# Patient Record
Sex: Male | Born: 1955
Health system: Southern US, Community
[De-identification: ages and names within clinical notes are randomized; demographics above are authoritative.]

## PROBLEM LIST (undated history)

## (undated) DIAGNOSIS — E119 Type 2 diabetes mellitus without complications: Secondary | ICD-10-CM

## (undated) DIAGNOSIS — Z889 Allergy status to unspecified drugs, medicaments and biological substances status: Secondary | ICD-10-CM

## (undated) DIAGNOSIS — I10 Essential (primary) hypertension: Secondary | ICD-10-CM

## (undated) HISTORY — DX: Type 2 diabetes mellitus without complications: E11.9

## (undated) HISTORY — DX: Allergy status to unspecified drugs, medicaments and biological substances: Z88.9

---
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
## 2004-03-20 ENCOUNTER — Encounter: Admission: RE | Admit: 2004-03-20 | Discharge: 2004-03-20 | Payer: Self-pay | Admitting: Family Medicine

## 2011-06-04 ENCOUNTER — Encounter: Payer: Self-pay | Admitting: Student

## 2011-06-04 ENCOUNTER — Emergency Department (HOSPITAL_BASED_OUTPATIENT_CLINIC_OR_DEPARTMENT_OTHER)
Admission: EM | Admit: 2011-06-04 | Discharge: 2011-06-04 | Disposition: A | Discharge status: Home / Self Care | Payer: Self-pay | Attending: Emergency Medicine | Admitting: Emergency Medicine

## 2011-06-04 DIAGNOSIS — L089 Local infection of the skin and subcutaneous tissue, unspecified: Secondary | ICD-10-CM

## 2011-06-04 DIAGNOSIS — Y92009 Unspecified place in unspecified non-institutional (private) residence as the place of occurrence of the external cause: Secondary | ICD-10-CM | POA: Insufficient documentation

## 2011-06-04 DIAGNOSIS — I10 Essential (primary) hypertension: Secondary | ICD-10-CM | POA: Insufficient documentation

## 2011-06-04 DIAGNOSIS — W540XXA Bitten by dog, initial encounter: Secondary | ICD-10-CM | POA: Insufficient documentation

## 2011-06-04 DIAGNOSIS — S91309A Unspecified open wound, unspecified foot, initial encounter: Secondary | ICD-10-CM | POA: Insufficient documentation

## 2011-06-04 DIAGNOSIS — S61409A Unspecified open wound of unspecified hand, initial encounter: Secondary | ICD-10-CM | POA: Insufficient documentation

## 2011-06-04 HISTORY — DX: Essential (primary) hypertension: I10

## 2011-06-04 MED ORDER — TETANUS-DIPHTH-ACELL PERTUSSIS 5-2.5-18.5 LF-MCG/0.5 IM SUSP
0.5000 mL | Freq: Once | INTRAMUSCULAR | Status: AC
  Administered 2011-06-04: 0.5 mL via INTRAMUSCULAR
  Filled 2011-06-04: qty 0.5

## 2011-06-04 MED ORDER — AMOXICILLIN-POT CLAVULANATE 875-125 MG PO TABS: 1.0000 | ORAL_TABLET | Freq: Two times a day (BID) | ORAL | Status: AC

## 2011-06-04 MED ORDER — AMOXICILLIN-POT CLAVULANATE 875-125 MG PO TABS: 1.0000 | ORAL_TABLET | Freq: Once | ORAL | Status: DC

## 2011-06-04 MED ORDER — AMOXICILLIN-POT CLAVULANATE 875-125 MG PO TABS
1.0000 | ORAL_TABLET | Freq: Once | ORAL | Status: AC
  Administered 2011-06-04: 1 via ORAL
  Filled 2011-06-04: qty 1

## 2011-06-04 NOTE — ED Notes (Signed)
Pt in with c/o dog bite to left foot and left hand last night. Known animal. Animal control has not been notified. Bite happened in [place]. 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

## 2011-06-04 NOTE — ED Notes (Signed)
[facility] called for report of dog bite. Pt unable to name dog owner but has address of [address]. Pt can be contacted at [phone] H and [phone] Cell.

## 2011-06-05 NOTE — ED Provider Notes (Signed)
History     Chief Complaint  Patient presents with  . Animal Bite    left foot and left hand   HPI Comments: Yesterday without provocation, neighbor's dog broke leash and went after patient, patient tripped and fell down and was bitten to left foot and base of ankle, no sig bleeding there, was able to get up and walk, was reaching down to grab at the dog who then bit him in left hand and punctured into dorsum of little finger near tip of finger and also scratched into ring finger as well.  These injuries did bleed some, he cleaned them and used bandages to help decrease bleeding.  He went to work today and left little finger has gotten more swollen and painful.  No fevers, has no problems moving fingers or ankle and denies any sig pain.  His neighbor reported to him that rabies was up to date, but never saw documentation.  Pt's tetanus is not up to date.    Patient is a 55 y.o. male presenting with animal bite. The history is provided by the patient.  Animal Bite  Pertinent negatives include no numbness, no headaches, no light-headedness and no weakness.    Past Medical History  Diagnosis Date  . Hypertension     Past Surgical History  Procedure Date  . Fracture surgery     History reviewed. No pertinent family history.  History  Substance Use Topics  . Smoking status: Never Smoker   . Smokeless tobacco: Never Used  . Alcohol Use: No      Review of Systems  Constitutional: Negative.   Musculoskeletal: Positive for arthralgias.  Skin: Positive for wound.  Neurological: Negative for weakness, light-headedness, numbness and headaches.  Hematological: Negative for adenopathy.    Physical Exam  BP 141/74  Pulse 100  Temp(Src) 98.5 F (36.9 C) (Oral)  Resp 20  SpO2 95%  Physical Exam  Constitutional: He appears well-developed and well-nourished.  Non-toxic appearance.  Musculoskeletal:       Hands:      Feet:  Neurological: He is alert. He has normal strength.    Skin: Skin is warm and dry. Abrasion noted. No bruising and no laceration noted.    ED Course  Procedures  MDM Tetanus updated.  Animal control contacted and officer spoke to patient to visit pt's neighbor to file a report and make sure that dog has rabies up to date.  otherwise I presume animal control can quarantine if needed.  Pt voices understanding.  Will put on augmentin, primarily since little finger appears to show some early signs of cellulitis.  No evidence of infectious tenosynovitis at present.  Referred to Dr. Melvyn Novas.        Gavin Pound. Oletta Lamas, MD 06/05/11 [phone]

## 2012-12-20 ENCOUNTER — Encounter: Payer: Self-pay | Admitting: *Deleted

## 2012-12-20 ENCOUNTER — Ambulatory Visit (INDEPENDENT_AMBULATORY_CARE_PROVIDER_SITE_OTHER): Payer: BC Managed Care – PPO | Admitting: Family Medicine

## 2012-12-20 ENCOUNTER — Encounter: Payer: Self-pay | Admitting: Family Medicine

## 2012-12-20 VITALS — BP 128/76 | HR 78 | Temp 98.3°F | Ht 64.0 in | Wt 236.0 lb

## 2012-12-20 DIAGNOSIS — Z125 Encounter for screening for malignant neoplasm of prostate: Secondary | ICD-10-CM

## 2012-12-20 DIAGNOSIS — I10 Essential (primary) hypertension: Secondary | ICD-10-CM

## 2012-12-20 DIAGNOSIS — D171 Benign lipomatous neoplasm of skin and subcutaneous tissue of trunk: Secondary | ICD-10-CM

## 2012-12-20 DIAGNOSIS — G5712 Meralgia paresthetica, left lower limb: Secondary | ICD-10-CM

## 2012-12-20 DIAGNOSIS — D1779 Benign lipomatous neoplasm of other sites: Secondary | ICD-10-CM

## 2012-12-20 DIAGNOSIS — G5710 Meralgia paresthetica, unspecified lower limb: Secondary | ICD-10-CM

## 2012-12-20 HISTORY — DX: Meralgia paresthetica, left lower limb: G57.12

## 2012-12-20 HISTORY — DX: Benign lipomatous neoplasm of skin and subcutaneous tissue of trunk: D17.1

## 2012-12-20 HISTORY — DX: Essential (primary) hypertension: I10

## 2012-12-20 HISTORY — DX: Morbid (severe) obesity due to excess calories: E66.01

## 2012-12-20 LAB — LIPID PANEL
Cholesterol: 149 mg/dL (ref 0–200)
LDL Cholesterol: 98 mg/dL (ref 0–99)
Total CHOL/HDL Ratio: 4
VLDL: 14.4 mg/dL (ref 0.0–40.0)

## 2012-12-20 LAB — PSA: PSA: 1.05 ng/mL (ref 0.10–4.00)

## 2012-12-20 LAB — CBC WITH DIFFERENTIAL/PLATELET
Basophils Absolute: 0.1 10*3/uL (ref 0.0–0.1)
Eosinophils Relative: 2.4 % (ref 0.0–5.0)
Lymphocytes Relative: 28.8 % (ref 12.0–46.0)
Lymphs Abs: 1.4 10*3/uL (ref 0.7–4.0)
MCV: 88.3 fl (ref 78.0–100.0)
Monocytes Relative: 7.6 % (ref 3.0–12.0)
Neutrophils Relative %: 59.9 % (ref 43.0–77.0)
Platelets: 220 10*3/uL (ref 150.0–400.0)

## 2012-12-20 LAB — BASIC METABOLIC PANEL
BUN: 14 mg/dL (ref 6–23)
CO2: 27 mEq/L (ref 19–32)
Calcium: 9.4 mg/dL (ref 8.4–10.5)
Chloride: 107 mEq/L (ref 96–112)
Creatinine, Ser: 0.9 mg/dL (ref 0.4–1.5)
Potassium: 4.3 mEq/L (ref 3.5–5.1)
Sodium: 140 mEq/L (ref 135–145)

## 2012-12-20 LAB — HEPATIC FUNCTION PANEL: AST: 25 U/L (ref 0–37)

## 2012-12-20 LAB — TSH: TSH: 2.55 u[IU]/mL (ref 0.35–5.50)

## 2012-12-20 MED ORDER — LOSARTAN POTASSIUM 100 MG PO TABS: 100.0000 mg | ORAL_TABLET | Freq: Every day | ORAL | Status: DC

## 2012-12-20 NOTE — Patient Instructions (Addendum)
Follow up as scheduled for your physical We'll notify you of your lab results and make any changes if needed We'll call you with your nutrition appt Try and make healthy food choices- cut back on any drinks w/ sugar in it (including tea) Goal is to get some form of exercise or activity 4-5 days/week The lump on your back is called a lipoma and is just fine The thigh numbness is called meralgia paresthetica and is caused by irritation or compression of a nerve- will improve w/ weight loss Call with any questions or concerns Welcome!  We're glad to have you!

## 2012-12-20 NOTE — Assessment & Plan Note (Signed)
New.  Reviewed dx w/ pt.  Discussed that there is no need for removal unless he develops pain or area rapidly changes in size or shape.  Pt expressed understanding and is in agreement w/ plan.

## 2012-12-20 NOTE — Assessment & Plan Note (Signed)
New.  Reviewed dx w/ pt and discussed that this was most likely due to his abdominal obesity.  Pt to work on weight loss and see if sxs improve.  Will follow.

## 2012-12-20 NOTE — Assessment & Plan Note (Signed)
New.  Pt's BMI is 40.  Not exercising or paying particular attention to diet.  Will refer to nutrition for assistance.  Encouraged regular activity.  Will follow closely.

## 2012-12-20 NOTE — Assessment & Plan Note (Signed)
New to provider, chronic for pt.  Well controlled.  Asymptomatic.  Pt desires ARB rather than ACE due to cough.  Will switch and follow BP closely.

## 2012-12-20 NOTE — Progress Notes (Signed)
  Subjective:    Patient ID: Jacob Crawford, male    DOB: 1956-05-29, 57 y.o.   MRN: [id number]  HPI New to establish.  Previous MD- Jovita Kussmaul.  No recent CPE or blood work.  HTN- chronic problem, dx'd 20 yrs ago.  On Lisinopril 20mg  daily.  BP well controlled.  No CP, SOB, HAs, visual changes, edema.  Would like to switch to ARB due to cough.  R upper back 'knot'- 1st noticed ~1 yr ago, previous MD told him it was nothing concerning.  Not painful.  Not growing or changing.  No drainage or redness.  L lateral thigh numbness- started 'months ago as an itch'.  Evolved into a 'burning pain'.  sxs have not travelled or radiated.  Obesity- pt's BMI is 40.  Not exercising regularly or following particular diet.  Did buy Garcinia Cambogia and wanted advice prior to taking.  Will drink 1-2 large teas from [employer].  Rarely drinks soda.     Review of Systems For ROS see HPI     Objective:   Physical Exam  Vitals reviewed. Constitutional: He is oriented to person, place, and time. He appears well-developed and well-nourished. No distress.  obese  HENT:  Head: Normocephalic and atraumatic.  Eyes: Conjunctivae and EOM are normal. Pupils are equal, round, and reactive to light.  Neck: Normal range of motion. Neck supple. No thyromegaly present.  Cardiovascular: Normal rate, regular rhythm, normal heart sounds and intact distal pulses.   No murmur heard. Pulmonary/Chest: Effort normal and breath sounds normal. No respiratory distress.  Abdominal: Soft. Bowel sounds are normal. He exhibits no distension.  Musculoskeletal: He exhibits no edema.  5 cm soft tissue mass consistent w/ lipoma just R of spine in upper back  Lymphadenopathy:    He has no cervical adenopathy.  Neurological: He is alert and oriented to person, place, and time. He has normal reflexes. No cranial nerve deficit.  Skin: Skin is warm and dry.  Psychiatric: He has a normal mood and affect. His behavior is normal.           Assessment & Plan:

## 2012-12-27 ENCOUNTER — Ambulatory Visit (INDEPENDENT_AMBULATORY_CARE_PROVIDER_SITE_OTHER): Payer: BC Managed Care – PPO | Admitting: Family Medicine

## 2012-12-27 ENCOUNTER — Encounter: Payer: Self-pay | Admitting: Family Medicine

## 2012-12-27 VITALS — BP 140/88 | HR 79 | Temp 98.2°F | Ht 64.0 in | Wt 236.4 lb

## 2012-12-27 DIAGNOSIS — Z1211 Encounter for screening for malignant neoplasm of colon: Secondary | ICD-10-CM

## 2012-12-27 DIAGNOSIS — Z0000 Encounter for general adult medical examination without abnormal findings: Secondary | ICD-10-CM | POA: Insufficient documentation

## 2012-12-27 HISTORY — DX: Encounter for general adult medical examination without abnormal findings: Z00.00

## 2012-12-27 NOTE — Patient Instructions (Addendum)
Follow up in 3 months to recheck BP We'll call you with your GI appt Try and get regular exercise and make healthy food choices Call with any questions or concerns [place] (hopefully!)

## 2012-12-27 NOTE — Assessment & Plan Note (Signed)
Pt's PE WNL w/ exception of obesity.  Reviewed labs from last visit.  Refer for colonoscopy.  EKG done- see document for interpretation.  Anticipatory guidance provided.

## 2012-12-27 NOTE — Progress Notes (Signed)
  Subjective:    Patient ID: Jacob Crawford, male    DOB: 1956-07-05, 57 y.o.   MRN: [id number]  HPI CPE- no concerns today, labs done at last visit.  Has never had colonoscopy.   Review of Systems Patient reports no vision/hearing changes, anorexia, fever ,adenopathy, persistant/recurrent hoarseness, swallowing issues, chest pain, palpitations, edema, persistant/recurrent cough, hemoptysis, dyspnea (rest,exertional, paroxysmal nocturnal), gastrointestinal  bleeding (melena, rectal bleeding), abdominal pain, excessive heart burn, GU symptoms (dysuria, hematuria, voiding/incontinence issues) syncope, focal weakness, memory loss, skin/hair/nail changes, depression, anxiety, abnormal bruising/bleeding, musculoskeletal symptoms/signs.     Objective:   Physical Exam BP 140/88  Pulse 79  Temp(Src) 98.2 F (36.8 C) (Oral)  Ht 5\' 4"  (1.626 m)  Wt 236 lb 6.4 oz (107.23 kg)  BMI 40.56 kg/m2  SpO2 95%  General Appearance:    Alert, cooperative, no distress, appears stated age  Head:    Normocephalic, without obvious abnormality, atraumatic  Eyes:    PERRL, conjunctiva/corneas clear, EOM's intact, fundi    benign, both eyes       Ears:    Normal TM's and external ear canals, both ears  Nose:   Nares normal, septum midline, mucosa normal, no drainage   or sinus tenderness  Throat:   Lips, mucosa, and tongue normal; teeth and gums normal  Neck:   Supple, symmetrical, trachea midline, no adenopathy;       thyroid:  No enlargement/tenderness/nodules  Back:     Symmetric, no curvature, ROM normal, no CVA tenderness  Lungs:     Clear to auscultation bilaterally, respirations unlabored  Chest wall:    No tenderness or deformity  Heart:    Regular rate and rhythm, S1 and S2 normal, no murmur, rub   or gallop  Abdomen:     Soft, non-tender, bowel sounds active all four quadrants,    no masses, no organomegaly  Genitalia:    Normal male without lesion, discharge or tenderness  Rectal:    Normal  tone, normal prostate, no masses or tenderness  Extremities:   Extremities normal, atraumatic, no cyanosis or edema  Pulses:   2+ and symmetric all extremities  Skin:   Skin color, texture, turgor normal, no rashes or lesions  Lymph nodes:   Cervical, supraclavicular, and axillary nodes normal  Neurologic:   CNII-XII intact. Normal strength, sensation and reflexes      throughout          Assessment & Plan:

## 2012-12-30 ENCOUNTER — Encounter: Payer: Self-pay | Admitting: Gastroenterology

## 2013-01-02 ENCOUNTER — Encounter: Payer: Self-pay | Admitting: Family Medicine

## 2013-01-02 ENCOUNTER — Ambulatory Visit (INDEPENDENT_AMBULATORY_CARE_PROVIDER_SITE_OTHER): Payer: BC Managed Care – PPO | Admitting: Family Medicine

## 2013-01-02 VITALS — BP 130/80 | HR 95 | Temp 98.4°F | Ht 64.0 in | Wt 236.8 lb

## 2013-01-02 DIAGNOSIS — J069 Acute upper respiratory infection, unspecified: Secondary | ICD-10-CM

## 2013-01-02 MED ORDER — PROMETHAZINE-DM 6.25-15 MG/5ML PO SYRP: 5.0000 mL | ORAL_SOLUTION | Freq: Four times a day (QID) | ORAL | Status: DC | PRN

## 2013-01-02 MED ORDER — FLUTICASONE PROPIONATE 50 MCG/ACT NA SUSP: 2.0000 | Freq: Every day | NASAL | Status: DC

## 2013-01-02 NOTE — Progress Notes (Signed)
  Subjective:    Patient ID: Jacob Crawford, male    DOB: 12/20/1955, 57 y.o.   MRN: [id number]  HPI URI- sxs started yesterday AM w/ 'scratchy throat'.  Today woke up w/ productive green cough, hoarseness, chest tightness.  'i know it's a sinus infection'.  + nasal congestion, HA.  No ear pain but fullness.  No fever.  + sick contacts- daughter w/ sinus infxn.  No personal hx of asthma.  Hx of seasonal allergies.  'if it's not treated w/ an antibiotic it will go into bronchitis'.     Review of Systems For ROS see HPI     Objective:   Physical Exam  Constitutional: He appears well-developed and well-nourished. No distress.  HENT:  Head: Normocephalic and atraumatic.  No TTP over sinuses + turbinate edema + PND TMs normal bilaterally  Eyes: Conjunctivae and EOM are normal. Pupils are equal, round, and reactive to light.  Neck: Normal range of motion. Neck supple.  Cardiovascular: Normal rate, regular rhythm and normal heart sounds.   Pulmonary/Chest: Effort normal and breath sounds normal. No respiratory distress. He has no wheezes. He has no rales.  No cough heard  Lymphadenopathy:    He has no cervical adenopathy.  Skin: Skin is warm and dry.          Assessment & Plan:

## 2013-01-02 NOTE — Patient Instructions (Addendum)
This appears to be an allergy/viral combo Start the Flonase- 2 sprays each nostril daily Use the cough syrup as needed- may cause drowsiness so take at home first to know how you'll respond Use Mucinex DM for daytime cough- this will not cause drowsiness Drink plenty of fluids REST! If no better by the end of the week or if symptoms change or worsen- please call Hang in there!!

## 2013-01-02 NOTE — Assessment & Plan Note (Signed)
New.  Suspect allergy/viral combo as there is no evidence of bacterial infxn on PE.  No need for abx.  Start nasal steroid, cough syrup prn.  Reviewed supportive care and red flags that should prompt return.  Pt unhappy that no abx written.

## 2013-01-05 ENCOUNTER — Telehealth: Payer: Self-pay | Admitting: *Deleted

## 2013-01-05 MED ORDER — AZITHROMYCIN 250 MG PO TABS: ORAL_TABLET | ORAL | Status: DC

## 2013-01-05 NOTE — Telephone Encounter (Signed)
Pt seen on 01-02-13 for URI and still no better. Pt still c/o sore throat, cough/pain in rib and unable to sleep due coughing spell. Pt notes that he does have cough med but it is now helping. Pt is requesting a antibiotic.  [facility]

## 2013-01-05 NOTE — Telephone Encounter (Signed)
Ok for [employer]- no other cough med options b/c of his codeine allergy.

## 2013-01-05 NOTE — Telephone Encounter (Signed)
Spoke with the pt and informed him that we will call-in Z-pak,but no cough med options b/c of his codeine allergy.    Pt understood and agreed.  New rx sent to the pharmacy([facility] [address]) by e-script.//AB/CMA

## 2013-01-20 ENCOUNTER — Encounter: Payer: BC Managed Care – PPO | Attending: Family Medicine | Admitting: Dietician

## 2013-01-20 ENCOUNTER — Encounter: Payer: Self-pay | Admitting: Dietician

## 2013-01-20 VITALS — Ht 64.0 in | Wt 236.4 lb

## 2013-01-20 DIAGNOSIS — Z713 Dietary counseling and surveillance: Secondary | ICD-10-CM | POA: Insufficient documentation

## 2013-01-20 DIAGNOSIS — I10 Essential (primary) hypertension: Secondary | ICD-10-CM | POA: Insufficient documentation

## 2013-01-20 NOTE — Progress Notes (Signed)
Medical Nutrition Therapy:  Appt start time: 0800 end time:  0900.  Assessment:  Primary concerns today: wt loss.   MEDICATIONS: see list.   DIETARY INTAKE:  Usual eating pattern includes 2 meals and 1 snacks per day.  Everyday foods include unsweet tea, OJ with meds.  Avoided foods include none.    24-hr recall:  B ( AM): unsweet iced tea; seldom eats breakfast (~twice per week- either sausage and egg biscuit or scrambled eggs and bacon)  Snk ( AM): generally none  L ( PM): grilled chix salad from [employer] with fat free [place] dressing, sometimes ranch or blue cheese. With cherry tomatoes, 3-4 pieces cucumber, usually with cheese. Usually will eat fast food, occasionally will get a burger, BBQ, fried chix. Has made concerted efforts in past 2 months to eat salad for lunch. Unsweet iced tea with sweet n low. Snk ( PM): eats something as soon as arriving home- small bag of chips, salad, etc. D ( PM): eats 7-830, occasionally very late- will eat a meat in large portion, some veg (salad, wide variety of vegetables, sometimes starchy veg), limited amount of starch (potatoes, rice). Snk ( PM): not much, states he does not eat much sweets Beverages: 1 can soda every 2 weeks. Small amount of OJ in AM with meds.   Pt states snack type foods are not kept much in the house. Also he does not keep much in the way of sweets around.  Pt states preference for mostly protein foods and veg, not many starches.  Usual physical activity: begins work between 0730 and 0830, sometimes 0630. His day consists of lots of sitting and some walking around job site, which is relatively light.   Portion control of dinner meal is stated as a concern by pt's spouse. Physical activity is other primary concern noted by pt family.  Progress Towards Goal(s):  In progress.   Nutritional Diagnosis:  [place]-3.3 Overweight/obesity As related to low PA, history of poor portion control of high kcal foods, such as meats at  dinner, fast foods for lunch at work.  As evidenced by BMI=40.7.    Intervention:  Nutrition counseling provided regarding portion sizes of certain foods, the Plate Method for controlling the dinner plate, and the importance of breakfast (especially protein foods at this time) in aiding wt loss efforts. Counseling provided regarding best choices when eating out for lunch at fast food restaurants, best types of snack foods, and portion control of snacking.   RD discussed with patient the importance of PA above rest (increased HR) to aid in wt loss and general health improvement.   RD established goals with the patient that are considered achievable and realistic with his preferences and lifestyle.  Handouts given during visit include:  Hypertension Nutrition Therapy  Home Workout  Goals:  Mr. Yapp will eat a protein food with breakfast 4 days per week.  Mr. Weidler will eat dinner 4 days a week that is half nonstarchy vegetables, one quarter protein, and one quarter starch. Mr. Lant will walk 150 minutes per week.   Monitoring/Evaluation:  Dietary intake, exercise, portion control, and body weight in 5 week(s). Consider [place], sagittal diameter, or waist circumference measure to aid in tracking of progress.

## 2013-01-20 NOTE — Patient Instructions (Addendum)
Goals:  Jacob Crawford will eat a protein food with breakfast 4 days per week.  Jacob Crawford will eat dinner 4 days a week that is half nonstarchy vegetables, one quarter protein, and one quarter starch. Jacob Crawford will walk 150 minutes per week.

## 2013-02-02 ENCOUNTER — Encounter: Payer: Self-pay | Admitting: Gastroenterology

## 2013-02-02 ENCOUNTER — Ambulatory Visit (AMBULATORY_SURGERY_CENTER): Payer: BC Managed Care – PPO | Admitting: *Deleted

## 2013-02-02 VITALS — Ht 63.0 in | Wt 237.8 lb

## 2013-02-02 DIAGNOSIS — Z1211 Encounter for screening for malignant neoplasm of colon: Secondary | ICD-10-CM

## 2013-02-02 MED ORDER — MOVIPREP 100 G PO SOLR: ORAL | Status: DC

## 2013-02-17 ENCOUNTER — Encounter: Payer: Self-pay | Admitting: Gastroenterology

## 2013-02-17 ENCOUNTER — Telehealth: Payer: Self-pay | Admitting: Family Medicine

## 2013-02-17 ENCOUNTER — Ambulatory Visit (AMBULATORY_SURGERY_CENTER): Payer: BC Managed Care – PPO | Admitting: Gastroenterology

## 2013-02-17 VITALS — BP 146/97 | HR 76 | Temp 97.8°F | Resp 16 | Ht 63.0 in | Wt 237.0 lb

## 2013-02-17 DIAGNOSIS — Z1211 Encounter for screening for malignant neoplasm of colon: Secondary | ICD-10-CM

## 2013-02-17 DIAGNOSIS — D126 Benign neoplasm of colon, unspecified: Secondary | ICD-10-CM

## 2013-02-17 MED ORDER — SODIUM CHLORIDE 0.9 % IV SOLN: 500.0000 mL | INTRAVENOUS | Status: DC

## 2013-02-17 NOTE — Progress Notes (Signed)
Called to room to assist during endoscopic procedure.  Patient ID and intended procedure confirmed with present staff. Received instructions for my participation in the procedure from the performing physician.  

## 2013-02-17 NOTE — Progress Notes (Signed)
Report to pacu rn, vss, bbs=clear 

## 2013-02-17 NOTE — Patient Instructions (Addendum)

## 2013-02-17 NOTE — Op Note (Signed)
[facility] [address] [place], [address]   COLONOSCOPY PROCEDURE REPORT  PATIENT: Jacob Crawford, Jacob Crawford  MR#: [id number] BIRTHDATE: 08-Apr-1956 , 56  yrs. old GENDER: Male ENDOSCOPIST: Mardella Layman, MD, Clementeen Graham REFERRED BY:  Sheliah Hatch, M.D. PROCEDURE DATE:  02/17/2013 PROCEDURE:   Colonoscopy, screening and Colonoscopy with snare polypectomy ASA CLASS:   Class II INDICATIONS:Average risk patient for colon cancer. MEDICATIONS: Propofol (Diprivan) 230 mg IV  DESCRIPTION OF PROCEDURE:   After the risks and benefits and of the procedure were explained, informed consent was obtained.  A digital rectal exam revealed no abnormalities of the rectum.    The LB [id number]  endoscope was introduced through the anus and advanced to the cecum, which was identified by both the appendix and ileocecal valve .  The quality of the prep was good, using MoviPrep .  The instrument was then slowly withdrawn as the colon was fully examined.     COLON FINDINGS: A polypoid shaped semi-pedunculated polyp ranging between 5-70mm in size was found in the descending colon.  A polypectomy was performed using snare cautery.  The resection was complete and the polyp tissue was completely retrieved.   Two small smooth flat polyps were found in the sigmoid colon.  A polypectomy was performed using snare cautery.  The resection was complete and the polyp tissue was not retrieved.   The colon mucosa was otherwise normal.     Retroflexed views revealed no abnormalities. The scope was then withdrawn from the patient and the procedure completed.  COMPLICATIONS: There were no complications. ENDOSCOPIC IMPRESSION: 1.   Semi-pedunculated polyp ranging between 5-53mm in size was found in the descending colon; polypectomy was performed using snare cautery 2.   Two small flat polyps were found in the sigmoid colon; polypectomy was performed using snare cautery 3.   The colon mucosa  was otherwise normal  RECOMMENDATIONS: Repeat colonoscopy in 5 years if polyp adenomatous; otherwise 10 years Has sleep apnea and needs referral  REPEAT EXAM:  cc:  _______________________________ eSignedMardella Layman, MD, [facility] 02/17/2013 8:48 AM

## 2013-02-17 NOTE — Telephone Encounter (Signed)
Caller: Lisa/Spouse; Phone: [phone]; Reason for Call: Caller reports that patient had a colonoscopy performed at [facility] today (02/17/13).  Dr Jarold Motto was very concerned with patient regarding sleep apnea.  Dr Jarold Motto advised for caller to get in touch with PCP today and begin steps of having patient evaluated before he undergoes any futher surgical procedures.  OFFICE PLEASE FOLLOW UP WITH CALLER.

## 2013-02-17 NOTE — Progress Notes (Signed)
Patient did not experience any of the following events: a burn prior to discharge; a fall within the facility; wrong site/side/patient/procedure/implant event; or a hospital transfer or hospital admission upon discharge from the facility. (G8907) Patient did not have preoperative order for IV antibiotic SSI prophylaxis. (G8918)  

## 2013-02-20 ENCOUNTER — Telehealth: Payer: Self-pay | Admitting: *Deleted

## 2013-02-20 ENCOUNTER — Ambulatory Visit: Payer: BC Managed Care – PPO | Admitting: Dietician

## 2013-02-20 NOTE — Telephone Encounter (Signed)
  Follow up Call-  Call back number 02/17/2013  Post procedure Call Back phone  # [phone]  Permission to leave phone message Yes     Patient questions:  Do you have a fever, pain , or abdominal swelling? no Pain Score  0 *  Have you tolerated food without any problems? yes  Have you been able to return to your normal activities? yes  Do you have any questions about your discharge instructions: Diet   no Medications  no Follow up visit  no  Do you have questions or concerns about your Care? no  Actions: * If pain score is 4 or above: No action needed, pain <4.

## 2013-02-21 ENCOUNTER — Telehealth: Payer: Self-pay | Admitting: Gastroenterology

## 2013-02-21 DIAGNOSIS — G4730 Sleep apnea, unspecified: Secondary | ICD-10-CM

## 2013-02-21 NOTE — Telephone Encounter (Signed)
Informed wife of appt with Dr Shelle Iron for 03/15/13 at 3pm.

## 2013-02-22 ENCOUNTER — Encounter: Payer: Self-pay | Admitting: Gastroenterology

## 2013-03-15 ENCOUNTER — Institutional Professional Consult (permissible substitution): Payer: BC Managed Care – PPO | Admitting: Pulmonary Disease

## 2013-03-29 ENCOUNTER — Institutional Professional Consult (permissible substitution): Payer: BC Managed Care – PPO | Admitting: Pulmonary Disease

## 2013-03-30 ENCOUNTER — Encounter: Payer: Self-pay | Admitting: Pulmonary Disease

## 2013-03-30 ENCOUNTER — Ambulatory Visit (INDEPENDENT_AMBULATORY_CARE_PROVIDER_SITE_OTHER): Payer: BC Managed Care – PPO | Admitting: Pulmonary Disease

## 2013-03-30 VITALS — BP 130/86 | HR 78 | Temp 97.1°F | Ht 63.0 in | Wt 245.0 lb

## 2013-03-30 DIAGNOSIS — G4733 Obstructive sleep apnea (adult) (pediatric): Secondary | ICD-10-CM

## 2013-03-30 HISTORY — DX: Obstructive sleep apnea (adult) (pediatric): G47.33

## 2013-03-30 NOTE — Progress Notes (Signed)
Subjective:    Patient ID: Jacob Crawford, male    DOB: 24-Dec-1955, 57 y.o.   MRN: [id number]  HPI The patient is a 57 year old male who been asked to see for possible obstructive sleep apnea.  The patient has been noted to have loud snoring during sleep, and in fact he sleeps on his sofa each night.  His wife has noticed an abnormal breathing pattern during sleep, and this was also noted at a recent colonoscopy.  The patient does not feel rested at least 50% in the mornings, but stays very busy at work which does not allow him to become sleepy.  In the evenings however, he will fall asleep as soon as he sits down and becomes quiet.  He is unable to get through television shows or movies.  He denies any issues with sleepiness while driving.  The patient states his weight is up 10 pounds over the last 2 years, and his Epworth score today is 7.  Sleep Questionnaire What time do you typically go to bed?( Between what hours) 10p-12a 10p-12a at 0947 on 03/30/13 by Nita Sells, CMA How long does it take you to fall asleep? within minutes within minutes at 0947 on 03/30/13 by Nita Sells, CMA How many times during the night do you wake up? 1 1 at 0947 on 03/30/13 by Nita Sells, CMA What time do you get out of bed to start your day? [id number] [id number] at 0947 on 03/30/13 by Nita Sells, CMA Do you drive or operate heavy machinery in your occupation? No No at 0947 on 03/30/13 by Nita Sells, CMA How much has your weight changed (up or down) over the past two years? (In pounds) No Value 10lb up/down at 0947 on 03/30/13 by Nita Sells, CMA Have you ever had a sleep study before? No No at 0947 on 03/30/13 by Nita Sells, CMA Do you currently use CPAP? No No at 0947 on 03/30/13 by Nita Sells, CMA Do you wear oxygen at any time? No     Review of Systems  Constitutional: Negative for fever and unexpected weight change.  HENT: Negative for ear pain, nosebleeds, congestion,  sore throat, rhinorrhea, sneezing, trouble swallowing, dental problem, postnasal drip and sinus pressure.   Eyes: Negative for redness and itching.  Respiratory: Negative for cough, chest tightness, shortness of breath and wheezing.   Cardiovascular: Negative for palpitations and leg swelling.  Gastrointestinal: Negative for nausea and vomiting.  Genitourinary: Negative for dysuria.  Musculoskeletal: Negative for joint swelling.  Skin: Negative for rash.  Neurological: Negative for headaches.  Hematological: Does not bruise/bleed easily.  Psychiatric/Behavioral: Negative for dysphoric mood. The patient is not nervous/anxious.        Objective:   Physical Exam Constitutional:  Obese male, no acute distress  HENT:  Nares patent without discharge, but narrowed bilat  Oropharynx without exudate, palate and uvula are thick and elongated, +side wall narrowing  Eyes:  Perrla, eomi, no scleral icterus  Neck:  No JVD, no TMG  Cardiovascular:  Normal rate, regular rhythm, no rubs or gallops.  No murmurs        Intact distal pulses  Pulmonary :  Normal breath sounds, no stridor or respiratory distress   No rales, rhonchi, or wheezing  Abdominal:  Soft, nondistended, bowel sounds present.  No tenderness noted.   Musculoskeletal:  No lower extremity edema noted.  Lymph Nodes:  No cervical lymphadenopathy noted  Skin:  No cyanosis noted  Neurologic:  Appears sleepy, but appropriate, moves all 4 extremities without obvious deficit.         Assessment & Plan:

## 2013-03-30 NOTE — Assessment & Plan Note (Signed)
The patient's history is very suggestive of clinically significant sleep apnea.  He is morbidly obese, has a large neck, has loud snoring and witnessed apneas with nonrestorative sleep, and also notes inappropriate sleepiness.  I've had a long discussion with him about the pathophysiology of sleep apnea, including its impact to his quality of life and cardiovascular health.  I think he needs to have a sleep study done, and he is an excellent candidate for home sleep testing

## 2013-03-30 NOTE — Patient Instructions (Addendum)
Will set up for sleep study, and arrange followup once results are available.  Work on weight loss

## 2013-04-10 ENCOUNTER — Encounter: Payer: BC Managed Care – PPO | Attending: Family Medicine | Admitting: Dietician

## 2013-04-10 ENCOUNTER — Encounter: Payer: Self-pay | Admitting: Dietician

## 2013-04-10 VITALS — Wt 238.8 lb

## 2013-04-10 DIAGNOSIS — I10 Essential (primary) hypertension: Secondary | ICD-10-CM | POA: Insufficient documentation

## 2013-04-10 DIAGNOSIS — Z713 Dietary counseling and surveillance: Secondary | ICD-10-CM | POA: Insufficient documentation

## 2013-04-10 NOTE — Progress Notes (Signed)
A: Pt reports this morning with weight gain of 2 lb 6 oz since last meeting. He has had a referral to pulmonology (where he weighed in at 245 lbs on 5/22) for sleep apnea, with no concrete diagnosis as of yet. Pt is awaiting overnight testing, but reports MD is confident sleep apnea diagnosis is correct.  Pt has adopted breakfast eating of eggs, eggs an bacon, or greek yogurt. He continues to eat predominantly salads for lunches.   Related to sleep apnea, pt reports he has been very tired all day, and has not yet increased PA, although he has gotten a membership to [employer].   Pt has stopped adding salt to some foods, and has avoided binge eating. Pt has begun to decrease portions of proteins at dinner, and has taken, particularly in past week, to filling up on vegetables.  Vegetables are reported as predominantly canned variety for dinner, over frozen and/or fresh.  Pt reports some hunger before lunch in AM, and is choosing nuts as main option for snack, at portion of palm.   I: RD reinforced positive changes to diet, such as adopting breakfast eating, and choosing mainly vegetables as method to fill himself. RD encouraged pt to continue toward other goals, particularly physical activity, with goal of 150 minutes per week (sleep apnea likely greatly infringing on this effort).  Per pt request, RD assigned set diet plan as outlined below: B- 1 CHO, 3-4 Pro, 2 fat L- 2 CHO, 3-4 Pro, 2 fat D- 2 CHO, 3-4 Pro, 2 fat 2 Snacks- 1 CHO, 1 Pro, 1 fat each For max intake of 1802 kcal per day, expected intake of 1552 kcal per day.  M/E: Monitor diet adherence, wt, body composition, PA in 6 weeks.

## 2013-04-17 ENCOUNTER — Ambulatory Visit: Payer: BC Managed Care – PPO | Admitting: Pulmonary Disease

## 2013-04-17 DIAGNOSIS — G4733 Obstructive sleep apnea (adult) (pediatric): Secondary | ICD-10-CM

## 2013-04-17 DIAGNOSIS — G4730 Sleep apnea, unspecified: Secondary | ICD-10-CM

## 2013-04-17 DIAGNOSIS — R0609 Other forms of dyspnea: Secondary | ICD-10-CM

## 2013-04-17 DIAGNOSIS — R0989 Other specified symptoms and signs involving the circulatory and respiratory systems: Secondary | ICD-10-CM

## 2013-04-17 DIAGNOSIS — G4710 Hypersomnia, unspecified: Secondary | ICD-10-CM

## 2013-04-19 ENCOUNTER — Other Ambulatory Visit: Payer: Self-pay | Admitting: Family Medicine

## 2013-04-28 ENCOUNTER — Telehealth: Payer: Self-pay | Admitting: Pulmonary Disease

## 2013-04-28 NOTE — Telephone Encounter (Signed)
Will call pt once results received.  Pt aware.

## 2013-05-03 ENCOUNTER — Telehealth: Payer: Self-pay | Admitting: Pulmonary Disease

## 2013-05-03 DIAGNOSIS — G4733 Obstructive sleep apnea (adult) (pediatric): Secondary | ICD-10-CM

## 2013-05-03 NOTE — Telephone Encounter (Signed)
Pt needs ov to review sleep study results.  

## 2013-05-04 NOTE — Telephone Encounter (Signed)
Please advise if this has been taken care of and can be closed, thank you!!

## 2013-05-04 NOTE — Telephone Encounter (Signed)
Yes this is a duplicate message---pt has been called to schedule

## 2013-05-04 NOTE — Telephone Encounter (Signed)
LMOM x 1 (home #) Unable to to schedule at this time. Pt will call back later--hes at work

## 2013-05-08 ENCOUNTER — Encounter: Payer: Self-pay | Admitting: Pulmonary Disease

## 2013-05-18 ENCOUNTER — Ambulatory Visit (INDEPENDENT_AMBULATORY_CARE_PROVIDER_SITE_OTHER): Payer: BC Managed Care – PPO | Admitting: Pulmonary Disease

## 2013-05-18 ENCOUNTER — Encounter: Payer: Self-pay | Admitting: Pulmonary Disease

## 2013-05-18 VITALS — BP 128/90 | HR 67 | Temp 98.2°F | Ht 63.0 in | Wt 237.8 lb

## 2013-05-18 DIAGNOSIS — G4733 Obstructive sleep apnea (adult) (pediatric): Secondary | ICD-10-CM

## 2013-05-18 NOTE — Progress Notes (Signed)
  Subjective:    Patient ID: Jacob Crawford, male    DOB: 1956/09/07, 57 y.o.   MRN: [id number]  HPI The patient comes in today for followup of his recent sleep testing.  He was found to have moderate obstructive sleep apnea, with an AHI of 21 events per hour and oxygen desaturation as low as 80%.  I have reviewed the study with him in detail, and answered all of his questions.   Review of Systems  Constitutional: Negative for fever and unexpected weight change.  HENT: Positive for sore throat ( scratchy throat). Negative for ear pain, nosebleeds, congestion, rhinorrhea, sneezing, trouble swallowing, dental problem, postnasal drip and sinus pressure.   Eyes: Negative for redness and itching.  Respiratory: Negative for cough, chest tightness, shortness of breath and wheezing.   Cardiovascular: Negative for palpitations and leg swelling.  Gastrointestinal: Negative for nausea and vomiting.  Genitourinary: Negative for dysuria.  Musculoskeletal: Negative for joint swelling.  Skin: Negative for rash.  Neurological: Negative for headaches.  Hematological: Does not bruise/bleed easily.  Psychiatric/Behavioral: Negative for dysphoric mood. The patient is not nervous/anxious.        Objective:   Physical Exam Obese male in no acute distress Nose without purulence or discharge noted Neck large, difficult to assess for lymphadenopathy and thyromegaly Lower extremities with mild edema, no cyanosis Awake, but does appear to be sleepy, moves all 4 extremities.        Assessment & Plan:

## 2013-05-18 NOTE — Assessment & Plan Note (Signed)
The patient has moderate obstructive sleep apnea, and given his history, I have recommended a trial of CPAP while working on weight loss.  I do not think he is a good candidate for surgery, but could consider a dental appliance.  The patient will would like to take the weekend and discuss with his wife, and will call me next week with his decision.

## 2013-05-18 NOTE — Patient Instructions (Addendum)
Think about starting on cpap while trying to work on weight loss.  I do not think surgery or dental appliance are good options Please call us next week to let us know what you decide.

## 2013-05-22 NOTE — Telephone Encounter (Signed)
Patient had appt 05/18/13 with KC. Nothing further needed.

## 2013-05-26 ENCOUNTER — Other Ambulatory Visit: Payer: Self-pay | Admitting: Pulmonary Disease

## 2013-05-26 ENCOUNTER — Telehealth: Payer: Self-pay | Admitting: Pulmonary Disease

## 2013-05-26 DIAGNOSIS — G4733 Obstructive sleep apnea (adult) (pediatric): Secondary | ICD-10-CM

## 2013-05-26 NOTE — Telephone Encounter (Signed)
Done

## 2013-05-26 NOTE — Telephone Encounter (Signed)
Pt had OV on 05-18-13 and was advised the following: Think about starting on cpap while trying to work on weight loss. I do not think surgery or dental appliance are good options  Please call us next week to let us know what you decide.   Pt states he is ready to start CPAP. I advised of the process. Please advise on what to order. Thanks. Carron Curie, CMA

## 2013-05-29 ENCOUNTER — Encounter: Payer: BC Managed Care – PPO | Attending: Family Medicine | Admitting: Dietician

## 2013-05-29 ENCOUNTER — Encounter: Payer: Self-pay | Admitting: Dietician

## 2013-05-29 DIAGNOSIS — I10 Essential (primary) hypertension: Secondary | ICD-10-CM | POA: Insufficient documentation

## 2013-05-29 DIAGNOSIS — Z713 Dietary counseling and surveillance: Secondary | ICD-10-CM | POA: Insufficient documentation

## 2013-05-29 NOTE — Progress Notes (Signed)
A: Pt reports with modest wt loss, and c/o of feeling "washed out" and very tired. He was working out 3 times per week between 75-90 minutes each (brisk walking on treadmill), and claimed he got down to about 233 lb, but his schedule had been thrown off by work starting July 1st. He hopes to return to workout schedule this week, and will be getting a CPAP machine this week to treat sleep apnea.   Pt reports no issues with following diet, the PA portion is more difficult due to very long hours at work. Pt reports so long as he truly monitors his snacking choices, and keeps his starch portions under control, he has maintained weight loss and has seen some modest progress here.   Pt motivation remains high and he has been effectively making food choices and controlling kcal intake to between 1500 and 1800 kcal, but physical activity is suffering due to work schedule and side effects of sleep apnea.  I: RD encouraged pt to continue with progress made in diet and counseled pt on importance of exercise/physical activity.   Continue plan of care, pt has expressed understanding of what needs to be done, and claims he will return to exercise this week.  M/E: Monitor body weight, exercise, diet. F/U in 2 months.

## 2013-07-22 ENCOUNTER — Other Ambulatory Visit: Payer: Self-pay | Admitting: Family Medicine

## 2013-07-25 NOTE — Telephone Encounter (Signed)
Rx filled and sent to pharmacy. Pt made aware of needing an office visit. SW, CMA

## 2013-07-31 ENCOUNTER — Ambulatory Visit: Payer: BC Managed Care – PPO | Admitting: Dietician

## 2013-09-19 ENCOUNTER — Other Ambulatory Visit: Payer: Self-pay | Admitting: Family Medicine

## 2013-09-19 NOTE — Telephone Encounter (Signed)
Med filled per provider. Letter mailed to pt to schedule appt.

## 2013-09-19 NOTE — Telephone Encounter (Signed)
Needs OV.  Ok for #30, no refills

## 2013-09-19 NOTE — Telephone Encounter (Signed)
Last OV 01-02-13 Med filled 9-13 #30 with 0

## 2013-10-13 ENCOUNTER — Ambulatory Visit: Payer: BC Managed Care – PPO | Admitting: Dietician

## 2013-10-17 ENCOUNTER — Ambulatory Visit (INDEPENDENT_AMBULATORY_CARE_PROVIDER_SITE_OTHER): Payer: BC Managed Care – PPO | Admitting: Family Medicine

## 2013-10-17 ENCOUNTER — Encounter: Payer: Self-pay | Admitting: Family Medicine

## 2013-10-17 VITALS — BP 134/82 | HR 83 | Temp 98.1°F | Ht 64.0 in | Wt 246.4 lb

## 2013-10-17 DIAGNOSIS — I10 Essential (primary) hypertension: Secondary | ICD-10-CM

## 2013-10-17 LAB — BASIC METABOLIC PANEL
BUN: 13 mg/dL (ref 6–23)
CO2: 23 mEq/L (ref 19–32)
Calcium: 8.8 mg/dL (ref 8.4–10.5)
Chloride: 105 mEq/L (ref 96–112)
Creatinine, Ser: 1.2 mg/dL (ref 0.4–1.5)
Glucose, Bld: 119 mg/dL — ABNORMAL HIGH (ref 70–99)

## 2013-10-17 MED ORDER — LOSARTAN POTASSIUM 100 MG PO TABS: 100.0000 mg | ORAL_TABLET | Freq: Every day | ORAL | Status: DC

## 2013-10-17 NOTE — Assessment & Plan Note (Signed)
Unchanged.  Pt has actually gained weight since last visit.  Encouraged regular, aerobic activity for 30 minutes at least 4x/week and monitoring caloric intake w/ the help of My[employer]itnessPal app.

## 2013-10-17 NOTE — Addendum Note (Signed)
Addended by: Verdie Shire on: 10/17/2013 10:06 AM   Modules accepted: Orders

## 2013-10-17 NOTE — Patient Instructions (Signed)
Schedule your complete physical in 6 months We'll notify you of your lab results and make any changes if needed Try and make healthy food choices and get regular exercise Call with any questions or concerns Happy Holidays!!!

## 2013-10-17 NOTE — Assessment & Plan Note (Signed)
Chronic problem.  Adequate control.  Asymptomatic.  Check labs.  No anticipated med changes 

## 2013-10-17 NOTE — Progress Notes (Signed)
   Subjective:    Patient ID: QUAN CYBULSKI, male    DOB: 12-Feb-1956, 57 y.o.   MRN: [id number]  HPI Pre visit review using our clinic review tool, if applicable. No additional management support is needed unless otherwise documented below in the visit note.  HTN- chronic problem, adequate control on Losartan.  Denies CP, SOB, HAs, visual changes, edema.  Obesity- not exercising regularly, attempting to eat healthy diet but has gained weight.   Review of Systems For ROS see HPI     Objective:   Physical Exam  Vitals reviewed. Constitutional: He is oriented to person, place, and time. He appears well-developed and well-nourished. No distress.  HENT:  Head: Normocephalic and atraumatic.  Eyes: Conjunctivae and EOM are normal. Pupils are equal, round, and reactive to light.  Neck: Normal range of motion. Neck supple. No thyromegaly present.  Cardiovascular: Normal rate, regular rhythm, normal heart sounds and intact distal pulses.   No murmur heard. Pulmonary/Chest: Effort normal and breath sounds normal. No respiratory distress.  Abdominal: Soft. Bowel sounds are normal. He exhibits no distension.  Musculoskeletal: He exhibits no edema.  Lymphadenopathy:    He has no cervical adenopathy.  Neurological: He is alert and oriented to person, place, and time. No cranial nerve deficit.  Skin: Skin is warm and dry.  Psychiatric: He has a normal mood and affect. His behavior is normal.          Assessment & Plan:

## 2013-10-17 NOTE — Progress Notes (Signed)
Pre visit review using our clinic review tool, if applicable. No additional management support is needed unless otherwise documented below in the visit note. 

## 2013-10-18 ENCOUNTER — Encounter: Payer: Self-pay | Admitting: *Deleted

## 2013-11-01 ENCOUNTER — Other Ambulatory Visit: Payer: Self-pay | Admitting: General Practice

## 2013-11-01 ENCOUNTER — Ambulatory Visit (INDEPENDENT_AMBULATORY_CARE_PROVIDER_SITE_OTHER): Payer: BC Managed Care – PPO | Admitting: Family Medicine

## 2013-11-01 ENCOUNTER — Encounter: Payer: Self-pay | Admitting: Family Medicine

## 2013-11-01 VITALS — BP 130/80 | HR 80 | Temp 98.4°F | Resp 16 | Wt 243.1 lb

## 2013-11-01 DIAGNOSIS — R1013 Epigastric pain: Secondary | ICD-10-CM

## 2013-11-01 HISTORY — DX: Epigastric pain: R10.13

## 2013-11-01 LAB — BASIC METABOLIC PANEL
BUN: 11 mg/dL (ref 6–23)
Chloride: 108 mEq/L (ref 96–112)
GFR: 100.83 mL/min (ref >60.00)
Potassium: 3.9 mEq/L (ref 3.5–5.1)
Sodium: 141 mEq/L (ref 135–145)

## 2013-11-01 LAB — CBC WITH DIFFERENTIAL/PLATELET
Basophils Absolute: 0 10*3/uL (ref 0.0–0.1)
Basophils Relative: 0.3 % (ref 0.0–3.0)
Eosinophils Absolute: 0.1 10*3/uL (ref 0.0–0.7)
Lymphocytes Relative: 26.6 % (ref 12.0–46.0)
Lymphs Abs: 1.4 10*3/uL (ref 0.7–4.0)
MCHC: 34.2 g/dL (ref 30.0–36.0)
MCV: 88.5 fl (ref 78.0–100.0)
Monocytes Absolute: 0.3 10*3/uL (ref 0.1–1.0)
Neutrophils Relative %: 65.6 % (ref 43.0–77.0)
Platelets: 206 10*3/uL (ref 150.0–400.0)
RDW: 13.5 % (ref 11.5–14.6)

## 2013-11-01 LAB — HEPATIC FUNCTION PANEL
ALT: 41 U/L (ref 0–53)
AST: 22 U/L (ref 0–37)
Bilirubin, Direct: 0.2 mg/dL (ref 0.0–0.3)
Total Bilirubin: 0.6 mg/dL (ref 0.3–1.2)

## 2013-11-01 LAB — H. PYLORI ANTIBODY, IGG: H Pylori IgG: NEGATIVE

## 2013-11-01 LAB — LIPASE: Lipase: 22 U/L (ref 11.0–59.0)

## 2013-11-01 MED ORDER — ONDANSETRON 4 MG PO TBDP: 4.0000 mg | ORAL_TABLET | Freq: Three times a day (TID) | ORAL | Status: DC | PRN

## 2013-11-01 MED ORDER — PANTOPRAZOLE SODIUM 40 MG PO TBEC: 40.0000 mg | DELAYED_RELEASE_TABLET | Freq: Every day | ORAL | Status: DC

## 2013-11-01 NOTE — Progress Notes (Signed)
   Subjective:    Patient ID: Jacob Crawford, male    DOB: 1956-07-04, 57 y.o.   MRN: [id number]  HPI sxs started 1 week ago, developed chills/sweats after eating.  + fatigue.  Nausea on Monday.  Slept most of yesterday.  Having substernal dull ache radiating under R rib around to back w/ epigastric pressure.  Pt feels sxs are consistent w/ gas.  No CP, SOB.  sxs are worse after eating- particularly 'fatty stuff'.  No radiation of pain to shoulder.  No vomiting.   Review of Systems For ROS see HPI     Objective:   Physical Exam  Vitals reviewed. Constitutional: He is oriented to person, place, and time. He appears well-developed and well-nourished. No distress.  HENT:  Head: Normocephalic and atraumatic.  Eyes: Conjunctivae and EOM are normal. Pupils are equal, round, and reactive to light.  Neck: Normal range of motion. Neck supple. No thyromegaly present.  Cardiovascular: Normal rate, regular rhythm, normal heart sounds and intact distal pulses.   No murmur heard. Pulmonary/Chest: Effort normal and breath sounds normal. No respiratory distress.  Abdominal: Soft. Bowel sounds are normal. He exhibits no distension. There is no tenderness. There is no rebound and no guarding.  Musculoskeletal: He exhibits no edema.  Lymphadenopathy:    He has no cervical adenopathy.  Neurological: He is alert and oriented to person, place, and time. No cranial nerve deficit.  Skin: Skin is warm and dry.  Psychiatric: He has a normal mood and affect. His behavior is normal.          Assessment & Plan:

## 2013-11-01 NOTE — Patient Instructions (Signed)
Follow up as needed We'll notify you of your lab results and make any changes if needed Someone will call you with your [place] appt to look for gallstones Try and limit fatty foods Start the Protonix daily to decrease acid reflux Use the Zofran as needed for nausea If symptoms change or worsen over the holiday, please call or go to the ER Call with any questions or concerns Happy Holidays!!

## 2013-11-01 NOTE — Assessment & Plan Note (Signed)
New.  Start Protonix for gas/bloating.  Check labs.  Zofran prn for nausea.  Reviewed dietary recommendations.  Will follow closely.

## 2013-11-01 NOTE — Progress Notes (Signed)
Pre visit review using our clinic review tool, if applicable. No additional management support is needed unless otherwise documented below in the visit note. 

## 2013-11-03 ENCOUNTER — Ambulatory Visit (HOSPITAL_BASED_OUTPATIENT_CLINIC_OR_DEPARTMENT_OTHER): Admission: RE | Admit: 2013-11-03 | Payer: BC Managed Care – PPO | Source: Ambulatory Visit

## 2013-11-03 ENCOUNTER — Ambulatory Visit (HOSPITAL_BASED_OUTPATIENT_CLINIC_OR_DEPARTMENT_OTHER)
Admission: RE | Admit: 2013-11-03 | Discharge: 2013-11-03 | Disposition: A | Discharge status: Home / Self Care | Payer: BC Managed Care – PPO | Source: Ambulatory Visit | Attending: Family Medicine | Admitting: Family Medicine

## 2013-11-03 ENCOUNTER — Encounter: Payer: Self-pay | Admitting: General Practice

## 2013-11-03 DIAGNOSIS — R1013 Epigastric pain: Secondary | ICD-10-CM

## 2013-11-03 DIAGNOSIS — Q618 Other cystic kidney diseases: Secondary | ICD-10-CM | POA: Insufficient documentation

## 2013-11-03 DIAGNOSIS — K7689 Other specified diseases of liver: Secondary | ICD-10-CM | POA: Insufficient documentation

## 2013-11-03 DIAGNOSIS — R1011 Right upper quadrant pain: Secondary | ICD-10-CM | POA: Insufficient documentation

## 2013-11-06 ENCOUNTER — Encounter: Payer: Self-pay | Admitting: Family Medicine

## 2014-04-16 ENCOUNTER — Telehealth: Payer: Self-pay

## 2014-04-16 NOTE — Telephone Encounter (Signed)
Medication List and allergies:  Updated and Reviewed  90 day supply/mail order: n/a Local prescriptions:  [id number] - [place], [place] - [address] AT [place], [place] [address]  Immunization due:  UTD  A/P: No changes to personal, family or [place] Flu- 08/09/13 Tdap- 06/04/11 CCS- 02/17/13- Dr. Leroy Kennedy polyps--repeat in 5 years (02/2018) PSA- 12/20/12- 1.05   Pt states that he has been OTC Prilosec for possible acid reflux.     To discuss with provider: Nothing at this time.

## 2014-04-17 ENCOUNTER — Encounter: Payer: Self-pay | Admitting: Family Medicine

## 2014-04-17 ENCOUNTER — Ambulatory Visit (INDEPENDENT_AMBULATORY_CARE_PROVIDER_SITE_OTHER): Payer: BC Managed Care – PPO | Admitting: Family Medicine

## 2014-04-17 VITALS — BP 124/78 | HR 83 | Temp 98.0°F | Resp 16 | Ht 64.5 in | Wt 240.4 lb

## 2014-04-17 DIAGNOSIS — Z0000 Encounter for general adult medical examination without abnormal findings: Secondary | ICD-10-CM

## 2014-04-17 LAB — CBC WITH DIFFERENTIAL/PLATELET
BASOS PCT: 0.6 % (ref 0.0–3.0)
Basophils Absolute: 0 10*3/uL (ref 0.0–0.1)
Eosinophils Absolute: 0.1 10*3/uL (ref 0.0–0.7)
Eosinophils Relative: 1.4 % (ref 0.0–5.0)
HCT: 46.2 % (ref 39.0–52.0)
HEMOGLOBIN: 15.9 g/dL (ref 13.0–17.0)
Lymphocytes Relative: 27.1 % (ref 12.0–46.0)
Lymphs Abs: 1.3 10*3/uL (ref 0.7–4.0)
MCHC: 34.5 g/dL (ref 30.0–36.0)
MCV: 89.6 fl (ref 78.0–100.0)
MONOS PCT: 6.3 % (ref 3.0–12.0)
Monocytes Absolute: 0.3 10*3/uL (ref 0.1–1.0)
NEUTROS ABS: 3.1 10*3/uL (ref 1.4–7.7)
Neutrophils Relative %: 64.6 % (ref 43.0–77.0)
Platelets: 215 10*3/uL (ref 150.0–400.0)
RBC: 5.15 Mil/uL (ref 4.22–5.81)
RDW: 13.1 % (ref 11.5–15.5)
WBC: 4.8 10*3/uL (ref 4.0–10.5)

## 2014-04-17 LAB — LIPID PANEL
CHOL/HDL RATIO: 4
CHOLESTEROL: 148 mg/dL (ref 0–200)
HDL: 35.5 mg/dL — ABNORMAL LOW (ref >39.00)
LDL CALC: 92 mg/dL (ref 0–99)
NonHDL: 112.5
Triglycerides: 102 mg/dL (ref 0.0–149.0)
VLDL: 20.4 mg/dL (ref 0.0–40.0)

## 2014-04-17 LAB — HEPATIC FUNCTION PANEL
ALBUMIN: 4 g/dL (ref 3.5–5.2)
ALT: 40 U/L (ref 0–53)
AST: 23 U/L (ref 0–37)
Alkaline Phosphatase: 65 U/L (ref 39–117)
Bilirubin, Direct: 0.2 mg/dL (ref 0.0–0.3)
TOTAL PROTEIN: 6.6 g/dL (ref 6.0–8.3)
Total Bilirubin: 0.8 mg/dL (ref 0.2–1.2)

## 2014-04-17 LAB — BASIC METABOLIC PANEL
BUN: 13 mg/dL (ref 6–23)
CO2: 25 mEq/L (ref 19–32)
Calcium: 9.2 mg/dL (ref 8.4–10.5)
Chloride: 108 mEq/L (ref 96–112)
Creatinine, Ser: 0.8 mg/dL (ref 0.4–1.5)
GFR: 103.5 mL/min (ref >60.00)
GLUCOSE: 108 mg/dL — AB (ref 70–99)
Potassium: 4 mEq/L (ref 3.5–5.1)
SODIUM: 141 meq/L (ref 135–145)

## 2014-04-17 LAB — PSA: PSA: 1.06 ng/mL (ref 0.10–4.00)

## 2014-04-17 LAB — HEMOGLOBIN A1C: Hgb A1c MFr Bld: 5.6 % (ref 4.6–6.5)

## 2014-04-17 LAB — TSH: TSH: 2.21 u[IU]/mL (ref 0.35–4.50)

## 2014-04-17 NOTE — Patient Instructions (Signed)
Follow up in 6 months to recheck BP We'll notify you of your lab results and make any changes if needed Try and make healthy food choices and get regular exercise Restart the Protonix daily for the reflux and the cough Call with any questions or concerns Have a great summer!

## 2014-04-17 NOTE — Assessment & Plan Note (Signed)
Pt's PE WNL w/ exception of morbid obesity.  UTD on colonoscopy.  Pt refusing testicular and rectal exams.  Check labs.  Anticipatory guidance provided.

## 2014-04-17 NOTE — Progress Notes (Signed)
Pre visit review using our clinic review tool, if applicable. No additional management support is needed unless otherwise documented below in the visit note. 

## 2014-04-17 NOTE — Progress Notes (Signed)
   Subjective:    Patient ID: Jacob Crawford, male    DOB: [birth year], 58 y.o.   MRN: [id number]  HPI CPE- UTD on colonoscopy.  No concerns   Review of Systems Patient reports no vision/hearing changes, anorexia, fever ,adenopathy, persistant/recurrent hoarseness, swallowing issues, chest pain, palpitations, edema, hemoptysis, dyspnea (rest,exertional, paroxysmal nocturnal), gastrointestinal  bleeding (melena, rectal bleeding), abdominal pain, excessive heart burn, GU symptoms (dysuria, hematuria, voiding/incontinence issues) syncope, focal weakness, memory loss, numbness & tingling, skin/hair/nail changes, depression, anxiety, abnormal bruising/bleeding, musculoskeletal symptoms/signs.  + recurrent cough, was told by ENT that it was GERD.  Not taking protonix regularly.     Objective:   Physical Exam BP 124/78  Pulse 83  Temp(Src) 98 F (36.7 C) (Oral)  Resp 16  Ht 5' 4.5" (1.638 m)  Wt 240 lb 6 oz (109.033 kg)  BMI 40.64 kg/m2  SpO2 93%  General Appearance:    Alert, cooperative, no distress, appears stated age  Head:    Normocephalic, without obvious abnormality, atraumatic  Eyes:    PERRL, conjunctiva/corneas clear, EOM's intact, fundi    benign, both eyes       Ears:    Normal TM's and external ear canals, both ears  Nose:   Nares normal, septum midline, mucosa normal, no drainage   or sinus tenderness  Throat:   Lips, mucosa, and tongue normal; teeth and gums normal  Neck:   Supple, symmetrical, trachea midline, no adenopathy;       thyroid:  No enlargement/tenderness/nodules  Back:     Symmetric, no curvature, ROM normal, no CVA tenderness  Lungs:     Clear to auscultation bilaterally, respirations unlabored  Chest wall:    No tenderness or deformity  Heart:    Regular rate and rhythm, S1 and S2 normal, no murmur, rub   or gallop  Abdomen:     Soft, non-tender, bowel sounds active all four quadrants,    no masses, no organomegaly  Genitalia:    Deferred at pt's  request  Rectal:    Extremities:   Extremities normal, atraumatic, no cyanosis or edema  Pulses:   2+ and symmetric all extremities  Skin:   Skin color, texture, turgor normal, no rashes or lesions  Lymph nodes:   Cervical, supraclavicular, and axillary nodes normal  Neurologic:   CNII-XII intact. Normal strength, sensation and reflexes      throughout          Assessment & Plan:

## 2014-04-28 ENCOUNTER — Other Ambulatory Visit: Payer: Self-pay | Admitting: Family Medicine

## 2014-04-30 NOTE — Telephone Encounter (Signed)
Med filled.  

## 2014-06-28 ENCOUNTER — Telehealth: Payer: Self-pay | Admitting: Family Medicine

## 2014-06-28 MED ORDER — MOMETASONE FUROATE 50 MCG/ACT NA SUSP: 2.0000 | Freq: Every day | NASAL | Status: DC

## 2014-06-28 NOTE — Telephone Encounter (Signed)
nasonex filled.

## 2014-06-28 NOTE — Telephone Encounter (Signed)
Ok for script for Nasonex 2 sprays each nostril daily, #1, 6 refills

## 2014-06-28 NOTE — Telephone Encounter (Signed)
Caller name: Lattie Haw Relation to pt: wife Call back number: [phone] or [phone] or [phone] Pharmacy:  [facility] [place] [facility] [place]  Reason for call:  Pt wants some nasal spray called in.  Does not want OTC flonsase.

## 2014-06-29 ENCOUNTER — Ambulatory Visit (INDEPENDENT_AMBULATORY_CARE_PROVIDER_SITE_OTHER): Payer: BC Managed Care – PPO | Admitting: Family Medicine

## 2014-06-29 ENCOUNTER — Encounter: Payer: Self-pay | Admitting: Family Medicine

## 2014-06-29 VITALS — BP 132/80 | HR 103 | Temp 98.3°F | Resp 17 | Wt 247.5 lb

## 2014-06-29 DIAGNOSIS — J0100 Acute maxillary sinusitis, unspecified: Secondary | ICD-10-CM

## 2014-06-29 MED ORDER — AMOXICILLIN 875 MG PO TABS: 875.0000 mg | ORAL_TABLET | Freq: Two times a day (BID) | ORAL | Status: DC

## 2014-06-29 NOTE — Progress Notes (Signed)
Pre visit review using our clinic review tool, if applicable. No additional management support is needed unless otherwise documented below in the visit note. 

## 2014-06-29 NOTE — Progress Notes (Signed)
   Subjective:    Patient ID: Jacob Crawford, male    DOB: 1956/06/01, 58 y.o.   MRN: [id number]  Headache  Associated symptoms include coughing.  Cough Associated symptoms include headaches.  Sore Throat  Associated symptoms include coughing and headaches.   URI- sxs started 2 days ago, 'i went from feeling great to feeling like the dickens'.  Subjective fever overnight.  + frontal and maxillary pain.  + HA.  + sore throat, R 'lung' pain.  No cough.  No known sick contacts.  Pt recently changed jobs and is in new building.   Review of Systems  Respiratory: Positive for cough.   Neurological: Positive for headaches.   For ROS see HPI     Objective:   Physical Exam  Constitutional: He appears well-developed and well-nourished. No distress.  HENT:  Head: Normocephalic and atraumatic.  Right Ear: Tympanic membrane normal.  Left Ear: Tympanic membrane normal.  Nose: Mucosal edema and rhinorrhea present. Right sinus exhibits maxillary sinus tenderness. Right sinus exhibits no frontal sinus tenderness. Left sinus exhibits maxillary sinus tenderness. Left sinus exhibits no frontal sinus tenderness.  Mouth/Throat: Mucous membranes are normal. Oropharyngeal exudate and posterior oropharyngeal erythema present. No posterior oropharyngeal edema.  + PND  Eyes: Conjunctivae and EOM are normal. Pupils are equal, round, and reactive to light.  Neck: Normal range of motion. Neck supple.  Cardiovascular: Normal rate, regular rhythm and normal heart sounds.   Pulmonary/Chest: Effort normal and breath sounds normal. No respiratory distress. He has no wheezes.  + hacking cough  Lymphadenopathy:    He has no cervical adenopathy.  Skin: Skin is warm and dry.          Assessment & Plan:

## 2014-06-29 NOTE — Patient Instructions (Signed)
Follow up as needed Start the Amoxicillin twice daily for infection Drink plenty of fluids REST! Start OTC Claritin or Zyrtec daily (generic is just as good!) Flonase- 2 sprays each nostril- daily Mucinex DM for cough/congestion Call with any questions or concerns Hang in there!

## 2014-06-29 NOTE — Assessment & Plan Note (Signed)
Pt's sxs and PE consistent w/ infxn.  Start abx.  Allergy meds as directed.  Reviewed supportive care and red flags that should prompt return.  Pt expressed understanding and is in agreement w/ plan.

## 2014-09-10 IMAGING — US US ABDOMEN COMPLETE
1 series · 14 of 25 positions shown · non-contrast
Comparison: None.

CLINICAL DATA: Right upper quadrant abdominal pain.

EXAM:
ULTRASOUND ABDOMEN COMPLETE

[Series 1: us abdomen complete · 0.39mm/px · 14 of 95 slices shown]
[im 1/95]
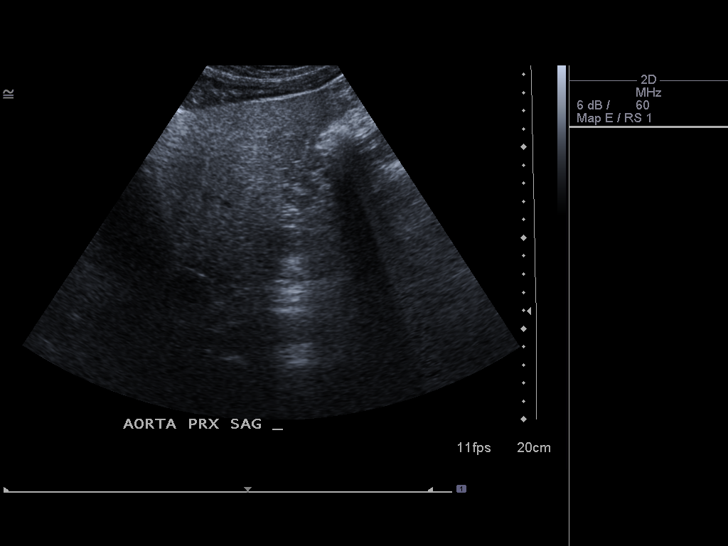
[im 8/95]
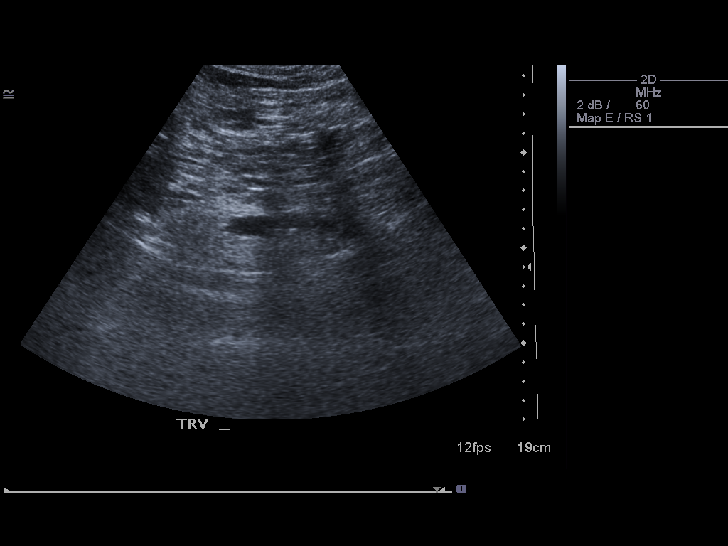
[im 16/95]
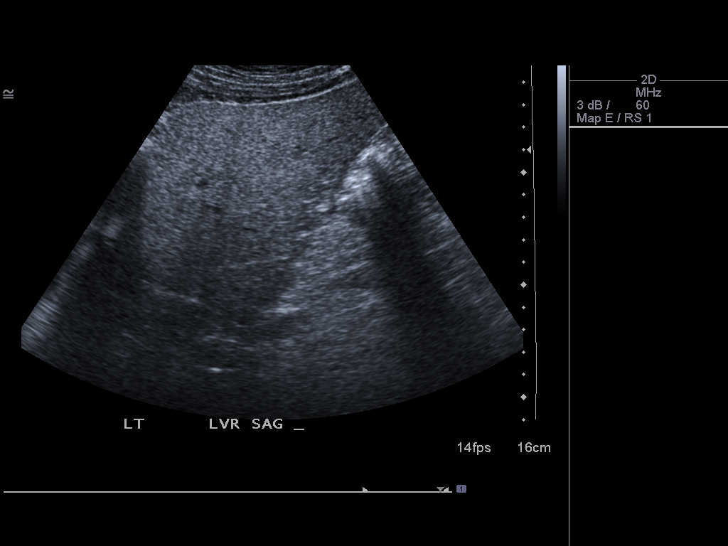
[im 24/95]
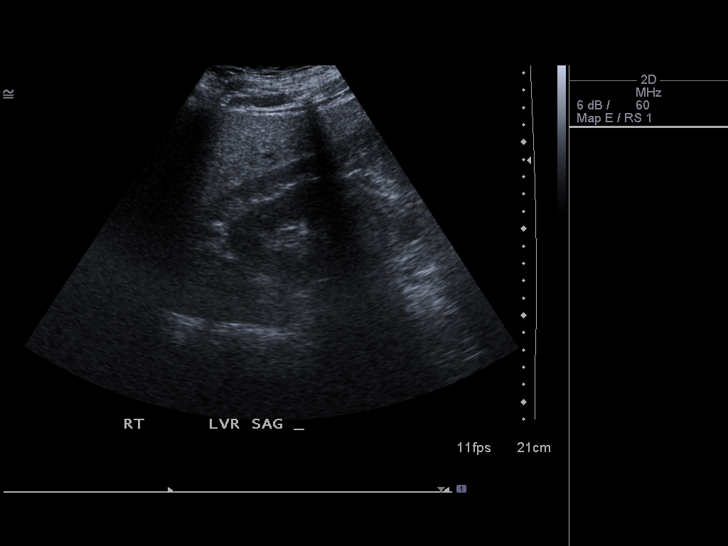
[im 32/95]
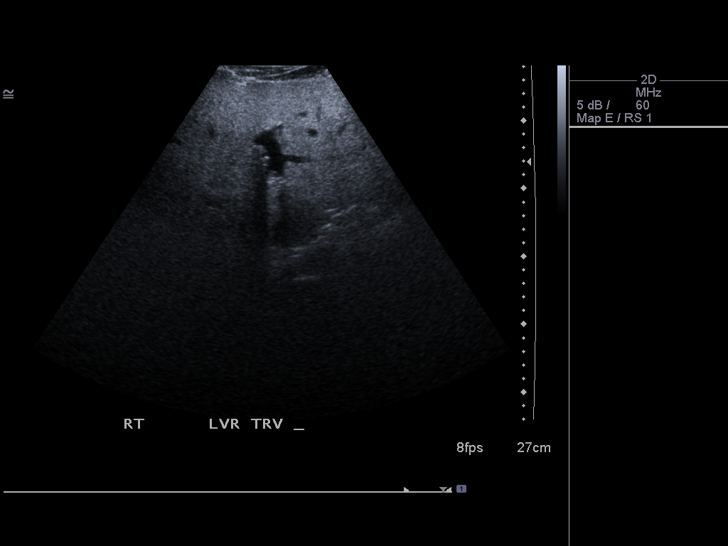
[im 36/95]
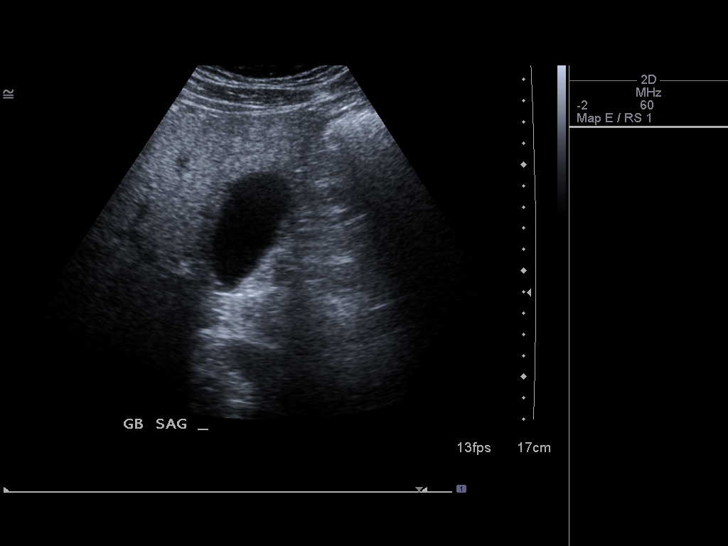
[im 44/95]
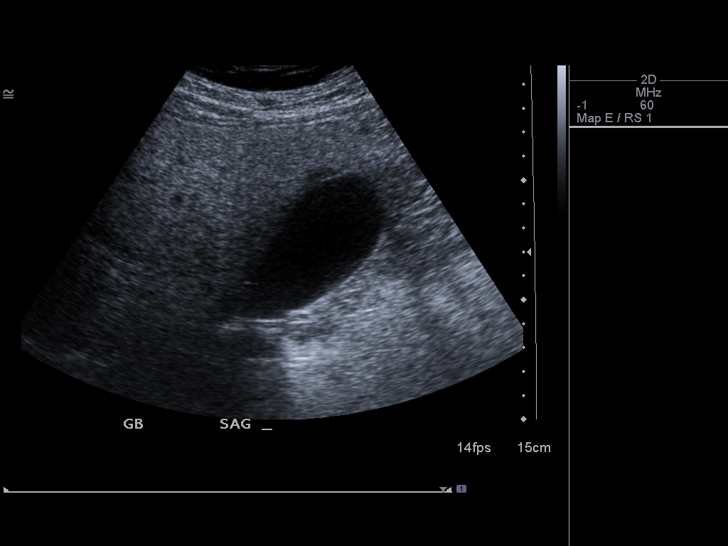
[im 51/95]
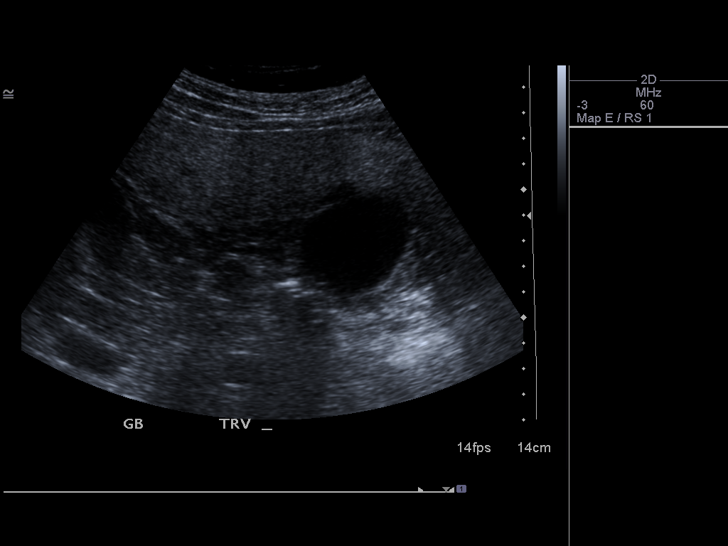
[im 59/95]
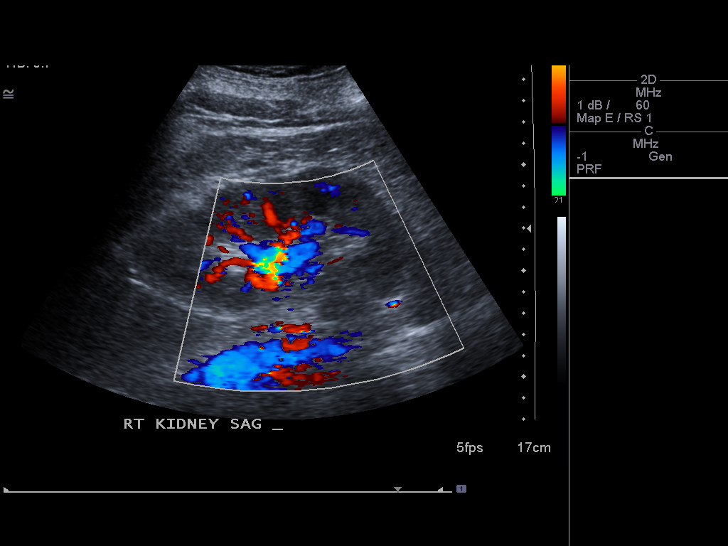
[im 63/95]
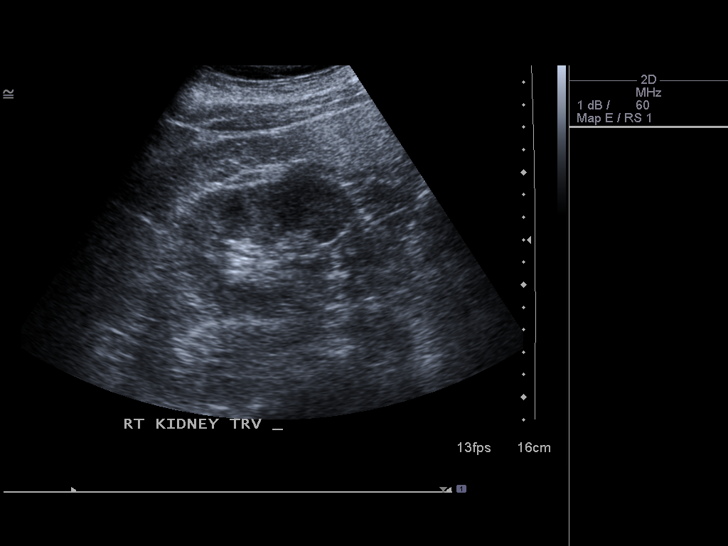
[im 71/95]
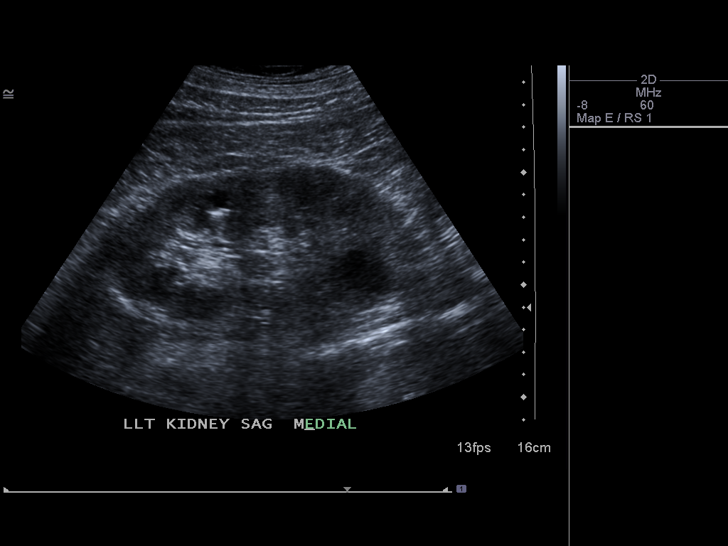
[im 79/95]
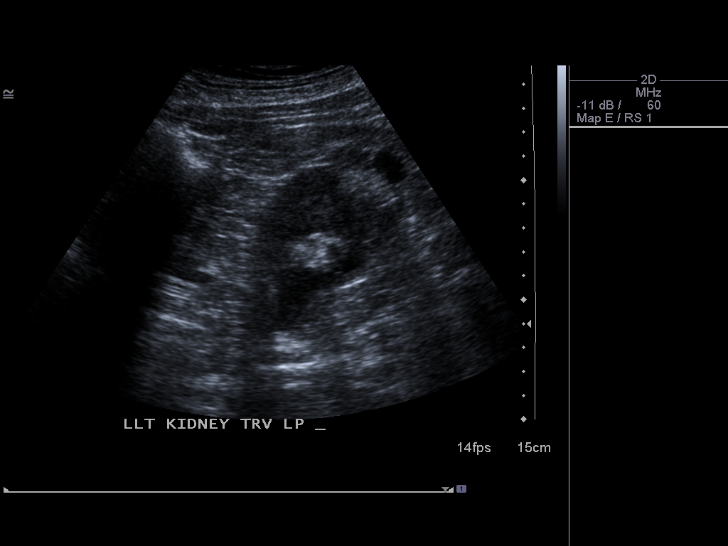
[im 87/95]
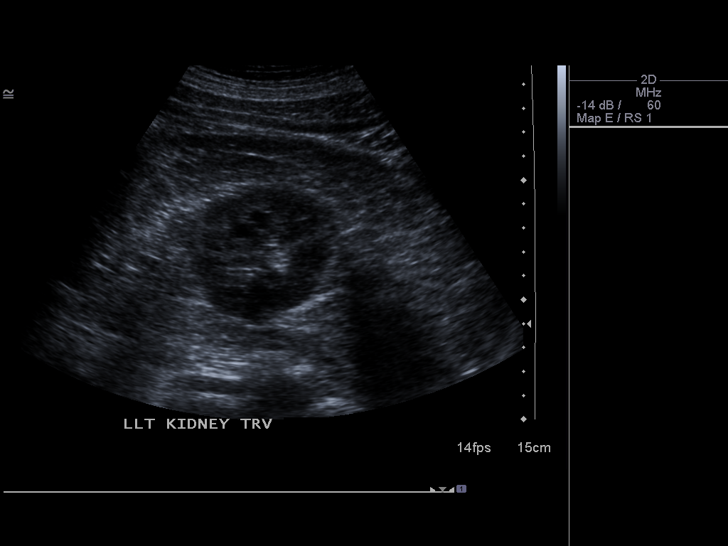
[im 95/95]
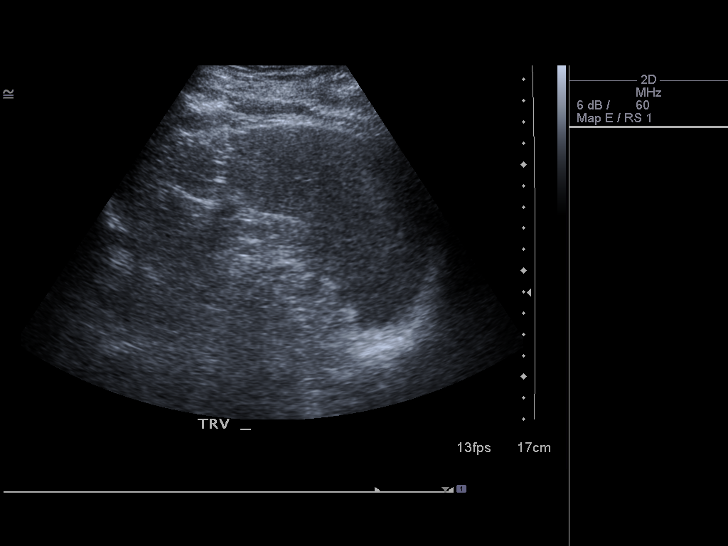

[14 of 25 positions shown; findings below may reference images not displayed]

FINDINGS: Gallbladder:

No gallstones or wall thickening visualized. No sonographic Murphy
sign noted.

Common bile duct:

Diameter: Measures 2.8 mm which is within normal limits.

Liver:

No focal lesion identified. Increased echogenicity is noted
consistent with fatty infiltration.

IVC:

No abnormality visualized.

Pancreas:

Visualized portion unremarkable.

Spleen:

Size and appearance within normal limits.

Right Kidney:

Length: 12.7 cm. Echogenicity within normal limits. No mass or
hydronephrosis visualized.

Left Kidney:

Length: 13.9 cm. Multiple cysts are noted with the largest 2.9 cm.
Echogenicity within normal limits. No mass or hydronephrosis
visualized.

Abdominal aorta:

No aneurysm visualized.

Other findings:

None.
IMPRESSION: Fatty infiltration of the liver. Left renal cysts. No other
abnormality seen in the abdomen.

## 2014-12-03 ENCOUNTER — Other Ambulatory Visit: Payer: Self-pay | Admitting: General Practice

## 2014-12-03 MED ORDER — LOSARTAN POTASSIUM 100 MG PO TABS: 100.0000 mg | ORAL_TABLET | Freq: Every day | ORAL | Status: DC

## 2014-12-31 ENCOUNTER — Telehealth: Payer: Self-pay | Admitting: Family Medicine

## 2014-12-31 ENCOUNTER — Other Ambulatory Visit: Payer: Self-pay | Admitting: Family Medicine

## 2014-12-31 NOTE — Telephone Encounter (Signed)
Med filled and letter mailed to pt to make BP follow up appt.

## 2014-12-31 NOTE — Telephone Encounter (Signed)
This medication was filled, however the pt is was due for a BP follow up in December. Pt needs to have this completed before his CPE in June.

## 2014-12-31 NOTE — Telephone Encounter (Signed)
Caller name: Yehudah, Standing Relation to pt: self  Call back number: [phone] Pharmacy: [place], [place] - [place] [phone] ([phone] (Fax)         Reason for call:  Pt requesting a refill for losartan (COZAAR) 100 MG tablet. Pt states as per pharmacy he has no refills and he is not due for CPE until June, please advise.

## 2015-01-04 ENCOUNTER — Ambulatory Visit (INDEPENDENT_AMBULATORY_CARE_PROVIDER_SITE_OTHER): Payer: BLUE CROSS/BLUE SHIELD | Admitting: Family Medicine

## 2015-01-04 ENCOUNTER — Encounter: Payer: Self-pay | Admitting: Family Medicine

## 2015-01-04 VITALS — BP 167/108 | HR 108 | Temp 98.5°F | Resp 16 | Wt 257.8 lb

## 2015-01-04 DIAGNOSIS — I10 Essential (primary) hypertension: Secondary | ICD-10-CM

## 2015-01-04 LAB — BASIC METABOLIC PANEL
BUN: 14 mg/dL (ref 6–23)
CALCIUM: 9.1 mg/dL (ref 8.4–10.5)
CO2: 25 meq/L (ref 19–32)
Chloride: 108 mEq/L (ref 96–112)
Creat: 0.95 mg/dL (ref 0.50–1.35)
Glucose, Bld: 77 mg/dL (ref 70–99)
POTASSIUM: 4.2 meq/L (ref 3.5–5.3)
SODIUM: 144 meq/L (ref 135–145)

## 2015-01-04 LAB — CBC WITH DIFFERENTIAL/PLATELET
BASOS ABS: 0 10*3/uL (ref 0.0–0.1)
Basophils Relative: 0 % (ref 0–1)
EOS PCT: 2 % (ref 0–5)
Eosinophils Absolute: 0.1 10*3/uL (ref 0.0–0.7)
HEMATOCRIT: 45.9 % (ref 39.0–52.0)
HEMOGLOBIN: 15.9 g/dL (ref 13.0–17.0)
LYMPHS ABS: 1.7 10*3/uL (ref 0.7–4.0)
Lymphocytes Relative: 28 % (ref 12–46)
MCH: 30.9 pg (ref 26.0–34.0)
MCHC: 34.6 g/dL (ref 30.0–36.0)
MCV: 89.1 fL (ref 78.0–100.0)
MONO ABS: 0.5 10*3/uL (ref 0.1–1.0)
MONOS PCT: 9 % (ref 3–12)
MPV: 8.9 fL (ref 8.6–12.4)
NEUTROS ABS: 3.7 10*3/uL (ref 1.7–7.7)
Neutrophils Relative %: 61 % (ref 43–77)
Platelets: 190 10*3/uL (ref 150–400)
RBC: 5.15 MIL/uL (ref 4.22–5.81)
RDW: 13.4 % (ref 11.5–15.5)
WBC: 6.1 10*3/uL (ref 4.0–10.5)

## 2015-01-04 MED ORDER — AMLODIPINE BESYLATE 5 MG PO TABS: 5.0000 mg | ORAL_TABLET | Freq: Every day | ORAL | Status: DC

## 2015-01-04 NOTE — Progress Notes (Signed)
   Subjective:    Patient ID: Jacob Crawford, male    DOB: [birth year], 59 y.o.   MRN: [id number]  HPI HTN- chronic problem, on Losartan 100mg  daily.  'if my blood pressure was high, I would've scheduled an appt to be here'  Pt reports he is asymptomatic.  Pt reports job has been stressful since September.  No CP, SOB, HAs, visual changes, edema.  Pt has gained 10 lbs since last visit.  No regular exercise.  Pt not following any particular diet.  Pt is very upset about having to be here today.   Review of Systems For ROS see HPI     Objective:   Physical Exam  Constitutional: He is oriented to person, place, and time. He appears well-developed and well-nourished. No distress.  HENT:  Head: Normocephalic and atraumatic.  Eyes: Conjunctivae and EOM are normal. Pupils are equal, round, and reactive to light.  Neck: Normal range of motion. Neck supple. No thyromegaly present.  Cardiovascular: Normal rate, regular rhythm, normal heart sounds and intact distal pulses.   No murmur heard. Pulmonary/Chest: Effort normal and breath sounds normal. No respiratory distress.  Abdominal: Soft. Bowel sounds are normal. He exhibits no distension.  Musculoskeletal: He exhibits no edema.  Lymphadenopathy:    He has no cervical adenopathy.  Neurological: He is alert and oriented to person, place, and time. No cranial nerve deficit.  Skin: Skin is warm and dry.  Psychiatric: He has a normal mood and affect. His behavior is normal.  Vitals reviewed.         Assessment & Plan:

## 2015-01-04 NOTE — Progress Notes (Signed)
Pre visit review using our clinic review tool, if applicable. No additional management support is needed unless otherwise documented below in the visit note. 

## 2015-01-04 NOTE — Patient Instructions (Signed)
Follow up in 3-4 weeks to recheck BP Continue the Losartan daily for elevated BP ADD the Amlodipine daily for BP Continue to limit your salt intake Try and get regular exercise Call with any questions or concerns Hang in there!!!

## 2015-01-06 NOTE — Assessment & Plan Note (Signed)
Deteriorated.  BP is not controlled.  Pt is asymptomatic.  Stressed need for at least q6 month monitoring based on risk of BP climbing if not followed and the fact that he's on ARB that can alter his kidney fxn.  Based on elevated readings, will add Amlodipine and pt will return in 3-4 weeks to recheck BP.  Pt expressed understanding and is in agreement w/ plan.

## 2015-01-25 ENCOUNTER — Ambulatory Visit (INDEPENDENT_AMBULATORY_CARE_PROVIDER_SITE_OTHER): Payer: BLUE CROSS/BLUE SHIELD | Admitting: Family Medicine

## 2015-01-25 ENCOUNTER — Encounter: Payer: Self-pay | Admitting: Family Medicine

## 2015-01-25 VITALS — BP 130/82 | HR 100 | Temp 98.1°F | Resp 16 | Wt 250.1 lb

## 2015-01-25 DIAGNOSIS — I10 Essential (primary) hypertension: Secondary | ICD-10-CM

## 2015-01-25 NOTE — Assessment & Plan Note (Signed)
Much improved since adding amlodipine.  Asymptomatic.  No need for labs.  No med changes at this time.  Applauded his decision to make healthy food choices and start regular exercise.

## 2015-01-25 NOTE — Progress Notes (Signed)
   Subjective:    Patient ID: Jacob Crawford, male    DOB: 15-Sep-1956, 59 y.o.   MRN: [id number]  HPI HTN- chronic problem, was elevated at last visit and Amlodipine was added to losartan.  BP much improved today.  Pt has not yet taken meds today.  Home BP was 117/79 at last check 2 weeks ago.  No CP, SOB, HAs, visual changes, edema.  Obesity- chronic problem, pt has lost 8 lbs since last visit!  Now drinking 2 protein shakes daily w/ healthy snacks and lunch in between.   Review of Systems For ROS see HPI     Objective:   Physical Exam  Constitutional: He is oriented to person, place, and time. He appears well-developed and well-nourished. No distress.  HENT:  Head: Normocephalic and atraumatic.  Eyes: Conjunctivae and EOM are normal. Pupils are equal, round, and reactive to light.  Neck: Normal range of motion. Neck supple. No thyromegaly present.  Cardiovascular: Normal rate, regular rhythm, normal heart sounds and intact distal pulses.   No murmur heard. Pulmonary/Chest: Effort normal and breath sounds normal. No respiratory distress.  Abdominal: Soft. Bowel sounds are normal. He exhibits no distension.  Musculoskeletal: He exhibits no edema.  Lymphadenopathy:    He has no cervical adenopathy.  Neurological: He is alert and oriented to person, place, and time. No cranial nerve deficit.  Skin: Skin is warm and dry.  Psychiatric: He has a normal mood and affect. His behavior is normal.  Vitals reviewed.         Assessment & Plan:

## 2015-01-25 NOTE — Assessment & Plan Note (Signed)
Chronic problem for pt.  He has lost 8 lbs in 3 weeks!  Applauded his decision to make healthy food choices and get regular exercise.  Will follow closely.

## 2015-01-25 NOTE — Progress Notes (Signed)
Pre visit review using our clinic review tool, if applicable. No additional management support is needed unless otherwise documented below in the visit note. 

## 2015-01-25 NOTE — Patient Instructions (Signed)
Schedule your complete physical in 6 months Keep up the good work!  You look great! I'm so proud of your healthy diet and new exercise!  This is great! Call with any questions or concerns Happy Spring!!!

## 2015-02-01 ENCOUNTER — Other Ambulatory Visit: Payer: Self-pay | Admitting: Family Medicine

## 2015-03-06 ENCOUNTER — Other Ambulatory Visit: Payer: Self-pay | Admitting: Family Medicine

## 2015-03-06 NOTE — Telephone Encounter (Signed)
Med filled.  

## 2015-04-10 ENCOUNTER — Telehealth: Payer: Self-pay | Admitting: Family Medicine

## 2015-04-10 MED ORDER — AMLODIPINE BESYLATE 5 MG PO TABS: 5.0000 mg | ORAL_TABLET | Freq: Every day | ORAL | Status: DC

## 2015-04-10 MED ORDER — LOSARTAN POTASSIUM 100 MG PO TABS: 100.0000 mg | ORAL_TABLET | Freq: Every day | ORAL | Status: DC

## 2015-04-10 NOTE — Telephone Encounter (Signed)
Pharmacy: [facility] [address]  Reason: Called pt to resched CPE from 06/07/15. Pt said this is the 2nd time our office has resched the appt. He states that he is to be seen every 6 months and has been having to call monthly for medication refills due to [place] resched his appt. He states he will need refills on Losartan & Amlodipine before his appt. Appt has been rescheduled to 10/24 at 3:30pm. He also states that he needs appt to be as late as possible in the day (at 4:00pm that was approved prior). Can we see him earlier than 10/24? Can we call in refills for him to last until his appt?

## 2015-04-10 NOTE — Telephone Encounter (Signed)
Medications were filled. Keep his name written down if we have an earlier cancellation then he can be scheduled there. We do not complete physicals later than 3:30pm. We will not complete an adult CPE at 4pm.

## 2015-04-11 NOTE — Telephone Encounter (Signed)
Notified pt that meds were refilled for him. Advised pt that he is on wait list for CPE cancellations and unfortunately we cannot accommodate 4:00pm request for appt.

## 2015-04-24 ENCOUNTER — Encounter: Payer: Self-pay | Admitting: Family Medicine

## 2015-05-20 ENCOUNTER — Encounter: Payer: Self-pay | Admitting: Family Medicine

## 2015-05-20 ENCOUNTER — Encounter (HOSPITAL_BASED_OUTPATIENT_CLINIC_OR_DEPARTMENT_OTHER): Payer: Self-pay

## 2015-05-20 ENCOUNTER — Ambulatory Visit (HOSPITAL_BASED_OUTPATIENT_CLINIC_OR_DEPARTMENT_OTHER)
Admission: RE | Admit: 2015-05-20 | Discharge: 2015-05-20 | Disposition: A | Discharge status: Home / Self Care | Payer: PRIVATE HEALTH INSURANCE | Source: Ambulatory Visit | Attending: Family Medicine | Admitting: Family Medicine

## 2015-05-20 ENCOUNTER — Ambulatory Visit (INDEPENDENT_AMBULATORY_CARE_PROVIDER_SITE_OTHER): Payer: Self-pay | Admitting: Family Medicine

## 2015-05-20 VITALS — BP 132/80 | HR 81 | Temp 98.3°F | Resp 16 | Ht 63.0 in | Wt 245.2 lb

## 2015-05-20 DIAGNOSIS — K760 Fatty (change of) liver, not elsewhere classified: Secondary | ICD-10-CM | POA: Insufficient documentation

## 2015-05-20 DIAGNOSIS — R319 Hematuria, unspecified: Secondary | ICD-10-CM | POA: Insufficient documentation

## 2015-05-20 DIAGNOSIS — N201 Calculus of ureter: Secondary | ICD-10-CM | POA: Diagnosis not present

## 2015-05-20 DIAGNOSIS — R911 Solitary pulmonary nodule: Secondary | ICD-10-CM | POA: Insufficient documentation

## 2015-05-20 HISTORY — DX: Hematuria, unspecified: R31.9

## 2015-05-20 LAB — POCT URINALYSIS DIPSTICK
Bilirubin, UA: NEGATIVE
Glucose, UA: NEGATIVE
KETONES UA: NEGATIVE
Leukocytes, UA: NEGATIVE
Nitrite, UA: NEGATIVE
PROTEIN UA: NEGATIVE
Spec Grav, UA: 1.015
Urobilinogen, UA: NEGATIVE
pH, UA: 6.5

## 2015-05-20 LAB — CBC WITH DIFFERENTIAL/PLATELET
Basophils Absolute: 0 10*3/uL (ref 0.0–0.1)
Basophils Relative: 0.5 % (ref 0.0–3.0)
Eosinophils Absolute: 0.1 10*3/uL (ref 0.0–0.7)
Eosinophils Relative: 2 % (ref 0.0–5.0)
HCT: 45.5 % (ref 39.0–52.0)
Hemoglobin: 15.5 g/dL (ref 13.0–17.0)
LYMPHS ABS: 1.5 10*3/uL (ref 0.7–4.0)
Lymphocytes Relative: 30.5 % (ref 12.0–46.0)
MCHC: 33.9 g/dL (ref 30.0–36.0)
MCV: 90.1 fl (ref 78.0–100.0)
Monocytes Absolute: 0.4 10*3/uL (ref 0.1–1.0)
Monocytes Relative: 7.7 % (ref 3.0–12.0)
NEUTROS PCT: 59.3 % (ref 43.0–77.0)
Neutro Abs: 2.9 10*3/uL (ref 1.4–7.7)
Platelets: 195 10*3/uL (ref 150.0–400.0)
RBC: 5.05 Mil/uL (ref 4.22–5.81)
RDW: 13.4 % (ref 11.5–15.5)
WBC: 4.8 10*3/uL (ref 4.0–10.5)

## 2015-05-20 MED ORDER — IOHEXOL 300 MG/ML  SOLN
125.0000 mL | Freq: Once | INTRAMUSCULAR | Status: AC | PRN
  Administered 2015-05-20: 125 mL via INTRAVENOUS

## 2015-05-20 NOTE — Patient Instructions (Signed)
We'll notify you of your lab results and CT results (this will eliminate a step when you get to urology) [facility] call you with your urology appt Continue to drink plenty of fluids We're sending your urine for culture to rule out infection Call with any questions or concerns Hang in there!  We're going to figure this out!

## 2015-05-20 NOTE — Assessment & Plan Note (Signed)
New.  Differential dx includes UTI, stone, NSAID induced bladder inflammation, malignancy.  Will send urine for culture to r/o infxn- although pt is asymptomatic.  Hold on abx at this time.  Get CT to r/o bladder mass vs stone.  Pt to hold NSAIDs, increase water intake.  Refer to urology.  Check CBC.  Reviewed supportive care and red flags that should prompt return.  Pt expressed understanding and is in agreement w/ plan.

## 2015-05-20 NOTE — Progress Notes (Signed)
   Subjective:    Patient ID: Jacob Crawford, male    DOB: 1956/04/04, 59 y.o.   MRN: [id number]  HPI Hematuria- did heavy lifting last Monday.  Developed stiffness in back and neck after that.  Thursday took Goody powder for headache and back pain.  Later that afternoon noticed pink urine.  Had 2nd episode that was darker in color.  Admits to limited water intake but does drink a lot of tea- has only had 1 glass of tea since and has increased his water intake.  Later that evening, urine was clear.  Again on Friday, took a [employer].  Again in the afternoon had pink urine.  Has had clear urine all weekend.  No burning w/ urination, no frequency or urgency.   Review of Systems For ROS see HPI     Objective:   Physical Exam  Constitutional: He is oriented to person, place, and time. He appears well-developed and well-nourished. No distress.  HENT:  Head: Normocephalic and atraumatic.  Abdominal: Soft. Bowel sounds are normal. He exhibits no distension. There is no tenderness. There is no rebound and no guarding.  No suprapubic tenderness, no CVA tenderness  Musculoskeletal: He exhibits no edema.  Neurological: He is alert and oriented to person, place, and time.  Skin: Skin is warm and dry. No erythema.  Psychiatric: He has a normal mood and affect. His behavior is normal. Thought content normal.  Vitals reviewed.         Assessment & Plan:

## 2015-05-20 NOTE — Progress Notes (Signed)
Pre visit review using our clinic review tool, if applicable. No additional management support is needed unless otherwise documented below in the visit note. 

## 2015-05-22 ENCOUNTER — Encounter: Payer: Self-pay | Admitting: Family Medicine

## 2015-05-22 LAB — URINE CULTURE
COLONY COUNT: NO GROWTH
Organism ID, Bacteria: NO GROWTH

## 2015-06-03 ENCOUNTER — Ambulatory Visit (INDEPENDENT_AMBULATORY_CARE_PROVIDER_SITE_OTHER): Payer: PRIVATE HEALTH INSURANCE | Admitting: Internal Medicine

## 2015-06-03 ENCOUNTER — Encounter: Payer: Self-pay | Admitting: Internal Medicine

## 2015-06-03 VITALS — BP 142/80 | HR 94 | Ht 63.0 in | Wt 244.0 lb

## 2015-06-03 DIAGNOSIS — R05 Cough: Secondary | ICD-10-CM

## 2015-06-03 DIAGNOSIS — R058 Other specified cough: Secondary | ICD-10-CM

## 2015-06-03 DIAGNOSIS — I10 Essential (primary) hypertension: Secondary | ICD-10-CM

## 2015-06-03 DIAGNOSIS — R911 Solitary pulmonary nodule: Secondary | ICD-10-CM

## 2015-06-03 MED ORDER — VALSARTAN 160 MG PO TABS: 160.0000 mg | ORAL_TABLET | Freq: Every day | ORAL | Status: DC

## 2015-06-03 NOTE — Progress Notes (Signed)
   Subjective:    Patient ID: CASHIS RILL, male    DOB: 1956/04/03,    MRN: [id number]  HPI  22 yowm never smoker but exposed as child to father smoking construction superviser with asbestos exp x one incident in the 90s self referral 06/03/15  to pulmonary clinic for SPN incidentally found on CT abd for kidney stone    06/03/2015 1st [facility] office visit/ Jody Silas   Chief Complaint  Patient presents with  . Pulmonary Consult    Self referral for eval of pulmonary nodule. Pt c/o cough- prod in the am with minimal light yellow sputum for the past several yrs.   c/'os daily cough each am  X 2-3 years worse when first wakes up productive then throat clear all days fades away over night never wakes him prematurely  Can't sing any more due to cough/ hoarseness and mild nasal congestion. No bloody mucus.  Note first started on acei but did not completely resolve p change to  losartan  No obvious  day to day or daytime variabilty or assoc chronic sob or cp or chest tightness, subjective wheez  or hb symptoms. No unusual exp hx or h/o childhood pna/ asthma or knowledge of premature birth.  Sleeping ok without nocturnal  or early am exacerbation  of respiratory  c/o's or need for noct saba. Also denies any obvious fluctuation of symptoms with weather or environmental changes or other aggravating or alleviating factors except as outlined above   Current Medications, Allergies, Complete Past Medical History, Past Surgical History, Family History, and Social History were reviewed in [employer] record.           Review of Systems  Constitutional: Negative for fever, chills, activity change, appetite change and unexpected weight change.  HENT: Positive for congestion and sneezing. Negative for dental problem, postnasal drip, rhinorrhea, sore throat, trouble swallowing and voice change.   Eyes: Negative for visual disturbance.  Respiratory: Positive for cough. Negative  for choking and shortness of breath.   Cardiovascular: Negative for chest pain and leg swelling.  Gastrointestinal: Negative for nausea, vomiting and abdominal pain.  Genitourinary: Negative for difficulty urinating.  Musculoskeletal: Positive for arthralgias.  Skin: Negative for rash.  Psychiatric/Behavioral: Negative for behavioral problems and confusion.       Objective:   Physical Exam   Pleasant amb obese wm nad  Wt Readings from Last 3 Encounters:  06/03/15 244 lb (110.678 kg)  05/20/15 245 lb 4 oz (111.245 kg)  01/25/15 250 lb 2 oz (113.456 kg)    Vital signs reviewed   HEENT: nl dentition, turbinates, and orophanx. Nl external ear canals without cough reflex   NECK :  without JVD/Nodes/TM/ nl carotid upstrokes bilaterally   LUNGS: no acc muscle use, clear to A and P bilaterally without cough on insp or exp maneuvers   CV:  RRR  no s3 or murmur or increase in P2, no edema   ABD:  soft and nontender with nl excursion in the supine position. No bruits or organomegaly, bowel sounds nl  MS:  warm without deformities, calf tenderness, cyanosis or clubbing  SKIN: warm and dry without lesions    NEURO:  alert, approp, no deficits   CT Abd 05/20/15  4 mm right lower lobe pulmonary nodule             Assessment & Plan:

## 2015-06-03 NOTE — Patient Instructions (Signed)
We will call you in 05/2016 to make sure you get you full CT chest without contrast  Stop cozar and start diovan (valsartan) 160 mg daily but you'll need to have your bp checked in a few weeks to be sure this is enough  GERD (REFLUX)  is an extremely common cause of respiratory symptoms just like yours , many times with no obvious heartburn at all.    It can be treated with medication, but also with lifestyle changes including elevation of the head of your bed (ideally with 6 inch  bed blocks),  Smoking cessation, avoidance of late meals, excessive alcohol, and avoid fatty foods, chocolate, peppermint, colas, red wine, and acidic juices such as orange juice.  NO MINT OR MENTHOL PRODUCTS SO NO COUGH DROPS  USE SUGARLESS CANDY INSTEAD (Jolley ranchers or Stover's or Life Savers) or even ice chips will also do - the key is to swallow to prevent all throat clearing. NO OIL BASED VITAMINS - use powdered substitutes.    Return in 6 weeks if cough not better - if better tell your frieinds!

## 2015-06-04 ENCOUNTER — Encounter: Payer: Self-pay | Admitting: Internal Medicine

## 2015-06-04 DIAGNOSIS — R911 Solitary pulmonary nodule: Secondary | ICD-10-CM

## 2015-06-04 DIAGNOSIS — R058 Other specified cough: Secondary | ICD-10-CM | POA: Insufficient documentation

## 2015-06-04 DIAGNOSIS — R05 Cough: Secondary | ICD-10-CM | POA: Insufficient documentation

## 2015-06-04 HISTORY — DX: Other specified cough: R05.8

## 2015-06-04 HISTORY — DX: Solitary pulmonary nodule: R91.1

## 2015-06-04 NOTE — Assessment & Plan Note (Signed)
For reasons that may related to vascular permability and nitric oxide pathways but not elevated  bradykinin levels (as seen with  ACEi use) losartan in the generic form has been reported now from mulitple sources  to cause a similar pattern of non-specific  upper airway symptoms as seen with acei.   This has not been reported with exposure to the other ARB's to date, so it seems reasonable for now to try either generic diovan  160 mg daily x 6 weeks to see what difference if any this makes. See:  Lelon Frohlich Allergy Asthma Immunol  2008: 101: p [phone]

## 2015-06-04 NOTE — Assessment & Plan Note (Signed)
-   prev h/o ACEi cough   The most common causes of chronic cough in immunocompetent adults include the following: upper airway cough syndrome (UACS), previously referred to as postnasal drip syndrome (PNDS), which is caused by variety of rhinosinus conditions; (2) asthma; (3) GERD; (4) chronic bronchitis from cigarette smoking or other inhaled environmental irritants; (5) nonasthmatic eosinophilic bronchitis; and (6) bronchiectasis.   These conditions, singly or in combination, have accounted for up to 94% of the causes of chronic cough in prospective studies.   Other conditions have constituted no >6% of the causes in prospective studies These have included bronchogenic carcinoma, chronic interstitial pneumonia, sarcoidosis, left ventricular failure, ACEI-induced cough, and aspiration from a condition associated with pharyngeal dysfunction.    Chronic cough is often simultaneously caused by more than one condition. A single cause has been found from 38 to 82% of the time, multiple causes from 18 to 62%. Multiply caused cough has been the result of three diseases up to 42% of the time.       Based on hx and exam, this is most likely:  Classic Upper airway cough syndrome, so named because it's frequently impossible to sort out how much is  CR/sinusitis with freq throat clearing (which can be related to primary GERD)   vs  causing  secondary (" extra esophageal")  GERD from wide swings in gastric pressure that occur with throat clearing, often  promoting self use of mint and menthol lozenges that reduce the lower esophageal sphincter tone and exacerbate the problem further in a cyclical fashion.   These are the same pts (now being labeled as having "irritable larynx syndrome" by some cough centers) who not infrequently have a history of having failed to tolerate ace inhibitors,  dry powder inhalers or biphosphonates or report having atypical reflux symptoms that don't respond to standard doses of PPI ,  and are easily confused as having aecopd or asthma flares by even experienced allergists/ pulmonologists.   The first step is to maximize acid suppression and eliminate  Losartan (see hbp) then regroup if the cough persists.  See instructions for specific recommendations which were reviewed directly with the patient who was given a copy with highlighter outlining the key components.

## 2015-06-04 NOTE — Assessment & Plan Note (Signed)
His risk is very very low despite asbestos exp (which was very brief) and passive smoking hx  CT results reviewed with pt >>> Too small for PET or bx, not suspicious enough for excisional bx > really only option for now is follow the Fleischner society guidelines as rec by radiology = 12 months full chest ct at that point.   Discussed in detail all the  indications, usual  risks and alternatives  relative to the benefits with patient /wife who agree  to proceed with conservative f/u as outlined.

## 2015-06-07 ENCOUNTER — Encounter: Payer: Self-pay | Admitting: Family Medicine

## 2015-06-29 ENCOUNTER — Encounter: Payer: Self-pay | Admitting: Family Medicine

## 2015-08-12 ENCOUNTER — Telehealth: Payer: Self-pay | Admitting: Family Medicine

## 2015-08-12 NOTE — Telephone Encounter (Signed)
Pre visit letter mailed 08/12/15  °

## 2015-08-28 ENCOUNTER — Telehealth: Payer: Self-pay | Admitting: Family Medicine

## 2015-08-29 NOTE — Telephone Encounter (Signed)
error 

## 2015-09-02 ENCOUNTER — Encounter: Payer: PRIVATE HEALTH INSURANCE | Admitting: Family Medicine

## 2015-09-05 ENCOUNTER — Ambulatory Visit (INDEPENDENT_AMBULATORY_CARE_PROVIDER_SITE_OTHER): Payer: PRIVATE HEALTH INSURANCE | Admitting: Family Medicine

## 2015-09-05 ENCOUNTER — Encounter: Payer: Self-pay | Admitting: Family Medicine

## 2015-09-05 VITALS — BP 130/78 | HR 74 | Temp 98.0°F | Resp 16 | Ht 63.0 in | Wt 247.2 lb

## 2015-09-05 DIAGNOSIS — Z0000 Encounter for general adult medical examination without abnormal findings: Secondary | ICD-10-CM | POA: Diagnosis not present

## 2015-09-05 DIAGNOSIS — L989 Disorder of the skin and subcutaneous tissue, unspecified: Secondary | ICD-10-CM | POA: Diagnosis not present

## 2015-09-05 HISTORY — DX: Disorder of the skin and subcutaneous tissue, unspecified: L98.9

## 2015-09-05 LAB — LIPID PANEL
CHOLESTEROL: 154 mg/dL (ref 0–200)
HDL: 39.6 mg/dL (ref >39.00)
LDL Cholesterol: 90 mg/dL (ref 0–99)
NONHDL: 114.82
Total CHOL/HDL Ratio: 4
Triglycerides: 124 mg/dL (ref 0.0–149.0)
VLDL: 24.8 mg/dL (ref 0.0–40.0)

## 2015-09-05 LAB — CBC WITH DIFFERENTIAL/PLATELET
BASOS ABS: 0 10*3/uL (ref 0.0–0.1)
Basophils Relative: 0.4 % (ref 0.0–3.0)
EOS ABS: 0.1 10*3/uL (ref 0.0–0.7)
Eosinophils Relative: 1.4 % (ref 0.0–5.0)
HCT: 47.8 % (ref 39.0–52.0)
HEMOGLOBIN: 15.9 g/dL (ref 13.0–17.0)
LYMPHS PCT: 29.3 % (ref 12.0–46.0)
Lymphs Abs: 1.6 10*3/uL (ref 0.7–4.0)
MCHC: 33.3 g/dL (ref 30.0–36.0)
MCV: 90.3 fl (ref 78.0–100.0)
MONOS PCT: 7.5 % (ref 3.0–12.0)
Monocytes Absolute: 0.4 10*3/uL (ref 0.1–1.0)
NEUTROS ABS: 3.4 10*3/uL (ref 1.4–7.7)
Neutrophils Relative %: 61.4 % (ref 43.0–77.0)
PLATELETS: 203 10*3/uL (ref 150.0–400.0)
RBC: 5.3 Mil/uL (ref 4.22–5.81)
RDW: 13.7 % (ref 11.5–15.5)
WBC: 5.5 10*3/uL (ref 4.0–10.5)

## 2015-09-05 LAB — HEPATIC FUNCTION PANEL
ALT: 36 U/L (ref 0–53)
AST: 22 U/L (ref 0–37)
Albumin: 4.4 g/dL (ref 3.5–5.2)
Alkaline Phosphatase: 62 U/L (ref 39–117)
Bilirubin, Direct: 0.1 mg/dL (ref 0.0–0.3)
TOTAL PROTEIN: 7.2 g/dL (ref 6.0–8.3)
Total Bilirubin: 0.6 mg/dL (ref 0.2–1.2)

## 2015-09-05 LAB — TSH: TSH: 1.52 u[IU]/mL (ref 0.35–4.50)

## 2015-09-05 LAB — BASIC METABOLIC PANEL
BUN: 13 mg/dL (ref 6–23)
CALCIUM: 9.4 mg/dL (ref 8.4–10.5)
CO2: 30 mEq/L (ref 19–32)
CREATININE: 0.85 mg/dL (ref 0.40–1.50)
Chloride: 105 mEq/L (ref 96–112)
GFR: 98.8 mL/min (ref >60.00)
GLUCOSE: 88 mg/dL (ref 70–99)
Potassium: 4.2 mEq/L (ref 3.5–5.1)
Sodium: 142 mEq/L (ref 135–145)

## 2015-09-05 LAB — PSA: PSA: 1.01 ng/mL (ref 0.10–4.00)

## 2015-09-05 NOTE — Progress Notes (Signed)
   Subjective:    Patient ID: Jacob Crawford, male    DOB: 07/24/1956, 59 y.o.   MRN: [id number]  HPI CPE- UTD on colonoscopy.  Pt has gained another 5 lbs.   Review of Systems Patient reports no vision/hearing changes, anorexia, fever ,adenopathy, persistant/recurrent hoarseness, swallowing issues, chest pain, palpitations, edema, persistant/recurrent cough, hemoptysis, dyspnea (rest,exertional, paroxysmal nocturnal), gastrointestinal  bleeding (melena, rectal bleeding), abdominal pain, excessive heart burn, GU symptoms (dysuria, hematuria, voiding/incontinence issues) syncope, focal weakness, memory loss, numbness & tingling, hair/nail changes, depression, anxiety, abnormal bruising/bleeding, musculoskeletal symptoms/signs.   Non-healing skin lesion on L forehead    Objective:   Physical Exam General Appearance:    Alert, cooperative, no distress, appears stated age, obese  Head:    Normocephalic, without obvious abnormality, atraumatic  Eyes:    PERRL, conjunctiva/corneas clear, EOM's intact, fundi    benign, both eyes       Ears:    Normal TM's and external ear canals, both ears  Nose:   Nares normal, septum midline, mucosa normal, no drainage   or sinus tenderness  Throat:   Lips, mucosa, and tongue normal; teeth and gums normal  Neck:   Supple, symmetrical, trachea midline, no adenopathy;       thyroid:  No enlargement/tenderness/nodules  Back:     Symmetric, no curvature, ROM normal, no CVA tenderness  Lungs:     Clear to auscultation bilaterally, respirations unlabored  Chest wall:    No tenderness or deformity  Heart:    Regular rate and rhythm, S1 and S2 normal, no murmur, rub   or gallop  Abdomen:     Soft, non-tender, bowel sounds active all four quadrants,    no masses, no organomegaly  Genitalia:    Deferred at pt's request  Rectal:    Extremities:   Extremities normal, atraumatic, no cyanosis or edema  Pulses:   2+ and symmetric all extremities  Skin:   Skin color,  texture, turgor normal, no rashes.  Non-healing skin lesion on L forehead  Lymph nodes:   Cervical, supraclavicular, and axillary nodes normal  Neurologic:   CNII-XII intact. Normal strength, sensation and reflexes      throughout          Assessment & Plan:

## 2015-09-05 NOTE — Progress Notes (Signed)
Pre visit review using our clinic review tool, if applicable. No additional management support is needed unless otherwise documented below in the visit note. 

## 2015-09-05 NOTE — Assessment & Plan Note (Signed)
Pt's PE WNL w/ exception of obesity and skin lesion on L forehead.  UTD on colonoscopy.  Check labs.  Anticipatory guidance provided.

## 2015-09-05 NOTE — Patient Instructions (Signed)
Follow up in 6 months to recheck BP We'll notify you of your lab results and make any changes if needed Try and make healthy food choices and get regular exercise- you can do it!!! We'll call you with your derm referral for the area on the forehead Call with any questions or concerns If you want to join [place] at the new [place] office, any scheduled appointments will automatically transfer and we will see you at [address] [place] [address] [place], [place] [address]  Happy Fall!!!

## 2015-09-05 NOTE — Assessment & Plan Note (Signed)
New.  Pt w/ nonhealing skin lesion on L forehead.  Will refer to derm for complete evaluation and tx.

## 2015-09-05 NOTE — Assessment & Plan Note (Signed)
Chronic problem.  Pt has gained another 5 lbs.  Is not exercising.  Stressed the need to make healthy food choices and get regular exercise.  Check labs.  Will follow.

## 2015-10-10 ENCOUNTER — Other Ambulatory Visit: Payer: Self-pay | Admitting: General Practice

## 2015-10-10 MED ORDER — AMLODIPINE BESYLATE 5 MG PO TABS: 5.0000 mg | ORAL_TABLET | Freq: Every day | ORAL | Status: DC

## 2015-12-02 ENCOUNTER — Telehealth: Payer: Self-pay | Admitting: Family Medicine

## 2015-12-02 NOTE — Telephone Encounter (Signed)
Caller name: Self  Can be reached: [phone] Pharmacy:  [place], [place] - [place] [phone] (Phone) [phone] (Fax)         Reason for call: Refill on amLODipine (NORVASC) 5 MG tablet [id number]

## 2015-12-02 NOTE — Telephone Encounter (Signed)
Notified patient that refills were sent in December for #30 with 6.

## 2016-01-03 ENCOUNTER — Encounter: Payer: Self-pay | Admitting: Family Medicine

## 2016-01-03 ENCOUNTER — Ambulatory Visit (INDEPENDENT_AMBULATORY_CARE_PROVIDER_SITE_OTHER): Payer: Managed Care, Other (non HMO) | Admitting: Family Medicine

## 2016-01-03 VITALS — BP 138/80 | HR 105 | Temp 98.7°F | Ht 63.0 in | Wt 251.0 lb

## 2016-01-03 DIAGNOSIS — J0100 Acute maxillary sinusitis, unspecified: Secondary | ICD-10-CM | POA: Diagnosis not present

## 2016-01-03 MED ORDER — PROMETHAZINE-DM 6.25-15 MG/5ML PO SYRP: 5.0000 mL | ORAL_SOLUTION | Freq: Four times a day (QID) | ORAL | Status: DC | PRN

## 2016-01-03 MED ORDER — AMOXICILLIN 875 MG PO TABS: 875.0000 mg | ORAL_TABLET | Freq: Two times a day (BID) | ORAL | Status: DC

## 2016-01-03 NOTE — Patient Instructions (Signed)
Follow up as needed Start the Amoxicillin twice daily- take w/ food Drink plenty of fluids Start the promethazine cough syrup as needed- will cause drowsiness Mucinex DM for daytime cough w/o drowsiness Continue the Flonase Add Claritin or Zyrtec daily for the allergy component REST! Call with any questions or concerns Hang in there!!!

## 2016-01-03 NOTE — Progress Notes (Signed)
   Subjective:    Patient ID: Jacob Crawford, male    DOB: Jun 25, 1956, 60 y.o.   MRN: [id number]  HPI URI- sxs started Wed w/ chest congestion and tightness.  Some SOB.  Cough productive of yellow sputum.  + nasal congestion.  Back and chest pain due to coughing.  + facial pressure.  Mild sore throat.  Bilateral ear itching.  + HA.  Tm 99.6.  Started Nasonex last night and Coricidin.  Aleve has improved HA.  + sick contacts.  Had some nausea Wed morning- improved w/ Zofran.  No tooth pain.   Review of Systems For ROS see HPI     Objective:   Physical Exam  Constitutional: He appears well-developed and well-nourished. No distress.  HENT:  Head: Normocephalic and atraumatic.  Right Ear: Tympanic membrane normal.  Left Ear: Tympanic membrane normal.  Nose: Mucosal edema and rhinorrhea present. Right sinus exhibits maxillary sinus tenderness and frontal sinus tenderness. Left sinus exhibits no maxillary sinus tenderness and no frontal sinus tenderness.  Mouth/Throat: Mucous membranes are normal. Oropharyngeal exudate and posterior oropharyngeal erythema present. No posterior oropharyngeal edema.  + PND  Eyes: Conjunctivae and EOM are normal. Pupils are equal, round, and reactive to light.  Neck: Normal range of motion. Neck supple.  Cardiovascular: Normal rate, regular rhythm and normal heart sounds.   Pulmonary/Chest: Effort normal and breath sounds normal. No respiratory distress. He has no wheezes.  + hacking cough  Lymphadenopathy:    He has no cervical adenopathy.  Skin: Skin is warm and dry.  Vitals reviewed.         Assessment & Plan:

## 2016-01-03 NOTE — Progress Notes (Signed)
Pre visit review using our clinic review tool, if applicable. No additional management support is needed unless otherwise documented below in the visit note. 

## 2016-01-03 NOTE — Assessment & Plan Note (Signed)
Pt's sxs and PE consistent w/ infxn.  Start abx.  Cough meds prn.  Reviewed supportive care and red flags that should prompt return.  Pt expressed understanding and is in agreement w/ plan.  

## 2016-01-14 ENCOUNTER — Encounter: Payer: Self-pay | Admitting: Family Medicine

## 2016-01-14 MED ORDER — AZITHROMYCIN 250 MG PO TABS: ORAL_TABLET | ORAL | Status: DC

## 2016-03-05 ENCOUNTER — Ambulatory Visit (INDEPENDENT_AMBULATORY_CARE_PROVIDER_SITE_OTHER): Payer: Managed Care, Other (non HMO) | Admitting: Family Medicine

## 2016-03-05 ENCOUNTER — Encounter: Payer: Self-pay | Admitting: Family Medicine

## 2016-03-05 VITALS — BP 132/86 | HR 84 | Temp 98.0°F | Resp 16 | Ht 63.0 in | Wt 248.5 lb

## 2016-03-05 DIAGNOSIS — I10 Essential (primary) hypertension: Secondary | ICD-10-CM

## 2016-03-05 LAB — CBC WITH DIFFERENTIAL/PLATELET
BASOS ABS: 0 {cells}/uL (ref 0–200)
Basophils Relative: 0 %
Eosinophils Absolute: 52 cells/uL (ref 15–500)
Eosinophils Relative: 1 %
HCT: 46.3 % (ref 38.5–50.0)
Hemoglobin: 15.9 g/dL (ref 13.2–17.1)
LYMPHS PCT: 28 %
Lymphs Abs: 1456 cells/uL (ref 850–3900)
MCH: 30.5 pg (ref 27.0–33.0)
MCHC: 34.3 g/dL (ref 32.0–36.0)
MCV: 88.9 fL (ref 80.0–100.0)
MONOS PCT: 7 %
MPV: 9.6 fL (ref 7.5–12.5)
Monocytes Absolute: 364 cells/uL (ref 200–950)
NEUTROS ABS: 3328 {cells}/uL (ref 1500–7800)
Neutrophils Relative %: 64 %
PLATELETS: 193 10*3/uL (ref 140–400)
RBC: 5.21 MIL/uL (ref 4.20–5.80)
RDW: 13.5 % (ref 11.0–15.0)
WBC: 5.2 10*3/uL (ref 3.8–10.8)

## 2016-03-05 NOTE — Assessment & Plan Note (Signed)
Chronic problem.  BP is mildly elevated today but still within range.  Asymptomatic.  Encouraged pt to work on [employer] and regular exercise.  Check labs.  No anticipated med changes.  Will follow.

## 2016-03-05 NOTE — Progress Notes (Signed)
Pre visit review using our clinic review tool, if applicable. No additional management support is needed unless otherwise documented below in the visit note. 

## 2016-03-05 NOTE — Patient Instructions (Signed)
Schedule your complete physical in 6 months We'll notify you of your lab results and make any changes if needed Continue to work on healthy diet and regular exercise- you can do it! Call with any questions or concerns Thanks for sticking with [place]! Happy Spring!!!

## 2016-03-05 NOTE — Progress Notes (Signed)
   Subjective:    Patient ID: Jacob Crawford, male    DOB: [birth year], 60 y.o.   MRN: [id number]  HPI HTN- chronic problem, on Amlodipine and Valsartan.  Pt reports feeling well.  No CP, SOB, HAs, visual changes, edema.  Pt reports walking between 2.5-3 miles/day at work.  Working 12-13 hr days.  Pt reports increased stress at work due to looming deadline.   Review of Systems For ROS see HPI     Objective:   Physical Exam  Constitutional: He is oriented to person, place, and time. He appears well-developed and well-nourished. No distress.  obese  HENT:  Head: Normocephalic and atraumatic.  Eyes: Conjunctivae and EOM are normal. Pupils are equal, round, and reactive to light.  Neck: Normal range of motion. Neck supple. No thyromegaly present.  Cardiovascular: Normal rate, regular rhythm, normal heart sounds and intact distal pulses.   No murmur heard. Pulmonary/Chest: Effort normal and breath sounds normal. No respiratory distress.  Abdominal: Soft. Bowel sounds are normal. He exhibits no distension.  Musculoskeletal: He exhibits no edema.  Lymphadenopathy:    He has no cervical adenopathy.  Neurological: He is alert and oriented to person, place, and time. No cranial nerve deficit.  Skin: Skin is warm and dry.  Psychiatric: He has a normal mood and affect. His behavior is normal.  Vitals reviewed.         Assessment & Plan:

## 2016-03-06 LAB — BASIC METABOLIC PANEL
BUN: 15 mg/dL (ref 7–25)
CALCIUM: 8.9 mg/dL (ref 8.6–10.3)
CO2: 20 mmol/L (ref 20–31)
CREATININE: 0.83 mg/dL (ref 0.70–1.33)
Chloride: 106 mmol/L (ref 98–110)
Glucose, Bld: 70 mg/dL (ref 65–99)
Potassium: 3.7 mmol/L (ref 3.5–5.3)
Sodium: 140 mmol/L (ref 135–146)

## 2016-04-30 ENCOUNTER — Encounter: Payer: Self-pay | Admitting: Family

## 2016-04-30 ENCOUNTER — Ambulatory Visit (INDEPENDENT_AMBULATORY_CARE_PROVIDER_SITE_OTHER): Payer: Managed Care, Other (non HMO) | Admitting: Family

## 2016-04-30 VITALS — BP 126/74 | HR 75 | Temp 98.0°F | Resp 18 | Ht 63.0 in | Wt 254.6 lb

## 2016-04-30 DIAGNOSIS — K0889 Other specified disorders of teeth and supporting structures: Secondary | ICD-10-CM

## 2016-04-30 MED ORDER — OXYCODONE-ACETAMINOPHEN 5-325 MG PO TABS: 1.0000 | ORAL_TABLET | Freq: Three times a day (TID) | ORAL | Status: DC | PRN

## 2016-04-30 MED ORDER — AMOXICILLIN-POT CLAVULANATE 875-125 MG PO TABS: 1.0000 | ORAL_TABLET | Freq: Two times a day (BID) | ORAL | Status: DC

## 2016-04-30 NOTE — Progress Notes (Signed)
Pre visit review using our clinic review tool, if applicable. No additional management support is needed unless otherwise documented below in the visit note. b  

## 2016-04-30 NOTE — Patient Instructions (Signed)
Please begin augmentin for dental infection. You may use percocet as needed for severe pain. Keep your upcoming appointment with the dentist.

## 2016-04-30 NOTE — Progress Notes (Signed)
Subjective:    Patient ID: Jacob Crawford, male    DOB: 02-09-1956, 60 y.o.   MRN: [id number]  HPI  Jacob Crawford is a 60 yr old male who presents today with chief complaint of dental pain. Pain began 1 week ago and is located in a right lower "canine"  tooth.  Pain worsened yesterday. Describes pain as gnawing pain, worse if he bites down.  Has an appointment with dental on Monday. Could not get in sooner. Has been using some percocet that he had on hand from kidney stone last year. This helped him to sleep.     Review of Systems See HPI  Past Medical History  Diagnosis Date  . Hypertension   . H/O seasonal allergies      Social History   Social History  . Marital Status: Married    Spouse Name: N/A  . Number of Children: N/A  . Years of Education: N/A   Occupational History  . [employer].     Social History Main Topics  . Smoking status: Never Smoker   . Smokeless tobacco: Never Used  . Alcohol Use: No  . Drug Use: No  . Sexual Activity: Not on file   Other Topics Concern  . Not on file   Social History Narrative    Past Surgical History  Procedure Laterality Date  . Fracture surgery  1965    skull    Family History  Problem Relation Age of Onset  . Arthritis Maternal Grandmother   . Arthritis Maternal Grandfather   . Heart disease Maternal Grandfather     MI  . Arthritis Paternal Grandmother   . Arthritis Paternal Grandfather   . Colon cancer Paternal Grandfather     died with colon cancer at age 25  . Sleep apnea Mother   . Stroke Mother   . Hypertension Mother   . Heart disease Father     multiple stent placements.  Marland Kitchen COPD Father     smoked  . Hypertension Father   . AAA (abdominal aortic aneurysm) Father   . Heart disease Cousin     MI  . Mental illness Son 22    anxiety    Allergies  Allergen Reactions  . Enalapril Maleate Cough  . Codeine Nausea And Vomiting  . Hydrochlorothiazide Nausea Only    Current Outpatient  Prescriptions on File Prior to Visit  Medication Sig Dispense Refill  . amLODipine (NORVASC) 5 MG tablet Take 1 tablet (5 mg total) by mouth daily. 30 tablet 6  . Aspirin-Acetaminophen-Caffeine (GOODY HEADACHE PO) Take 1 packet by mouth once as needed. For pain or headache      . fluticasone (FLONASE) 50 MCG/ACT nasal spray Place 2 sprays into both nostrils daily.    . naproxen sodium (ANAPROX) 220 MG tablet Take 220 mg by mouth 2 (two) times daily as needed.    . ondansetron (ZOFRAN ODT) 4 MG disintegrating tablet Take 1 tablet (4 mg total) by mouth every 8 (eight) hours as needed for nausea or vomiting. 30 tablet 0  . valsartan (DIOVAN) 160 MG tablet Take 1 tablet (160 mg total) by mouth daily. 30 tablet 11   No current facility-administered medications on file prior to visit.    BP 126/74 mmHg  Pulse 75  Temp(Src) 98 F (36.7 C) (Oral)  Resp 18  Ht 5\' 3"  (1.6 m)  Wt 254 lb 9.6 oz (115.486 kg)  BMI 45.11 kg/m2  SpO2 97%  Objective:   Physical Exam  Constitutional: He is oriented to person, place, and time. He appears well-developed and well-nourished. No distress.  HENT:  Head: Normocephalic and atraumatic.  Right Ear: Tympanic membrane and ear canal normal.  Left Ear: Tympanic membrane and ear canal normal.  No gingival swelling noted, no cheek swelling noted.    Cardiovascular: Normal rate and regular rhythm.   No murmur heard. Pulmonary/Chest: Effort normal and breath sounds normal. No respiratory distress. He has no wheezes. He has no rales.  Musculoskeletal: He exhibits no edema.  Neurological: He is alert and oriented to person, place, and time.  Skin: Skin is warm and dry.  Psychiatric: He has a normal mood and affect. His behavior is normal. Thought content normal.          Assessment & Plan:  Dental pain- suspect dental abscess, will rx with augmentin, prn percocet for pain, pt advised to keep his upcoming appointment with dental.

## 2016-05-05 ENCOUNTER — Other Ambulatory Visit: Payer: Self-pay | Admitting: Internal Medicine

## 2016-05-05 DIAGNOSIS — R911 Solitary pulmonary nodule: Secondary | ICD-10-CM

## 2016-05-19 ENCOUNTER — Ambulatory Visit (HOSPITAL_BASED_OUTPATIENT_CLINIC_OR_DEPARTMENT_OTHER)
Admission: RE | Admit: 2016-05-19 | Discharge: 2016-05-19 | Disposition: A | Discharge status: Home / Self Care | Payer: 59 | Source: Ambulatory Visit | Attending: Internal Medicine | Admitting: Internal Medicine

## 2016-05-19 DIAGNOSIS — R911 Solitary pulmonary nodule: Secondary | ICD-10-CM | POA: Diagnosis present

## 2016-05-19 DIAGNOSIS — N281 Cyst of kidney, acquired: Secondary | ICD-10-CM | POA: Diagnosis not present

## 2016-05-19 DIAGNOSIS — K760 Fatty (change of) liver, not elsewhere classified: Secondary | ICD-10-CM | POA: Diagnosis not present

## 2016-05-19 DIAGNOSIS — R918 Other nonspecific abnormal finding of lung field: Secondary | ICD-10-CM | POA: Insufficient documentation

## 2016-05-20 ENCOUNTER — Telehealth: Payer: Self-pay | Admitting: Internal Medicine

## 2016-05-20 DIAGNOSIS — N281 Cyst of kidney, acquired: Secondary | ICD-10-CM

## 2016-05-20 NOTE — Progress Notes (Signed)
Quick Note:  LMTCB ______ 

## 2016-05-20 NOTE — Telephone Encounter (Signed)
Result Note     Call patient : Lung Study is unremarkable, no need for f/u - there is a cyst on the kidney that does need f/u > refer to urology if not done, send copy of this note to Dr Birdie Riddle  --  [facility] x1

## 2016-05-21 NOTE — Telephone Encounter (Signed)
Called and spoke with pt and he is aware of results and that referral has been put in for pt to be seen by Urology and the pt requested that he get an appt with urology in [place].

## 2016-05-25 ENCOUNTER — Encounter: Payer: Self-pay | Admitting: Family Medicine

## 2016-05-25 DIAGNOSIS — I10 Essential (primary) hypertension: Secondary | ICD-10-CM

## 2016-05-25 MED ORDER — VALSARTAN 160 MG PO TABS: 160.0000 mg | ORAL_TABLET | Freq: Every day | ORAL | Status: DC

## 2016-05-25 NOTE — Telephone Encounter (Signed)
Medication filled to pharmacy as requested.   

## 2016-06-06 ENCOUNTER — Other Ambulatory Visit: Payer: Self-pay | Admitting: Internal Medicine

## 2016-06-06 DIAGNOSIS — I10 Essential (primary) hypertension: Secondary | ICD-10-CM

## 2016-06-30 ENCOUNTER — Other Ambulatory Visit: Payer: Self-pay | Admitting: Family Medicine

## 2016-09-07 ENCOUNTER — Encounter: Payer: Self-pay | Admitting: Family Medicine

## 2016-09-07 ENCOUNTER — Ambulatory Visit (INDEPENDENT_AMBULATORY_CARE_PROVIDER_SITE_OTHER): Payer: Managed Care, Other (non HMO) | Admitting: Family Medicine

## 2016-09-07 VITALS — BP 124/72 | HR 84 | Temp 98.1°F | Resp 17 | Ht 63.0 in | Wt 252.0 lb

## 2016-09-07 DIAGNOSIS — Z0000 Encounter for general adult medical examination without abnormal findings: Secondary | ICD-10-CM

## 2016-09-07 LAB — CBC WITH DIFFERENTIAL/PLATELET
BASOS ABS: 0 10*3/uL (ref 0.0–0.1)
Basophils Relative: 0.4 % (ref 0.0–3.0)
EOS ABS: 0.1 10*3/uL (ref 0.0–0.7)
Eosinophils Relative: 1.4 % (ref 0.0–5.0)
HEMATOCRIT: 48.3 % (ref 39.0–52.0)
Hemoglobin: 16.7 g/dL (ref 13.0–17.0)
LYMPHS ABS: 1.2 10*3/uL (ref 0.7–4.0)
LYMPHS PCT: 28.3 % (ref 12.0–46.0)
MCHC: 34.6 g/dL (ref 30.0–36.0)
MCV: 90 fl (ref 78.0–100.0)
Monocytes Absolute: 0.3 10*3/uL (ref 0.1–1.0)
Monocytes Relative: 7.1 % (ref 3.0–12.0)
NEUTROS ABS: 2.7 10*3/uL (ref 1.4–7.7)
NEUTROS PCT: 62.8 % (ref 43.0–77.0)
Platelets: 203 10*3/uL (ref 150.0–400.0)
RBC: 5.37 Mil/uL (ref 4.22–5.81)
RDW: 14 % (ref 11.5–15.5)
WBC: 4.3 10*3/uL (ref 4.0–10.5)

## 2016-09-07 LAB — HEPATIC FUNCTION PANEL
ALBUMIN: 4.5 g/dL (ref 3.5–5.2)
ALK PHOS: 79 U/L (ref 39–117)
ALT: 60 U/L — ABNORMAL HIGH (ref 0–53)
AST: 35 U/L (ref 0–37)
Bilirubin, Direct: 0.1 mg/dL (ref 0.0–0.3)
TOTAL PROTEIN: 7.4 g/dL (ref 6.0–8.3)
Total Bilirubin: 0.5 mg/dL (ref 0.2–1.2)

## 2016-09-07 LAB — PSA: PSA: 1.55 ng/mL (ref 0.10–4.00)

## 2016-09-07 LAB — LIPID PANEL
CHOLESTEROL: 151 mg/dL (ref 0–200)
HDL: 42.7 mg/dL (ref >39.00)
LDL Cholesterol: 92 mg/dL (ref 0–99)
NonHDL: 108.64
Total CHOL/HDL Ratio: 4
Triglycerides: 82 mg/dL (ref 0.0–149.0)
VLDL: 16.4 mg/dL (ref 0.0–40.0)

## 2016-09-07 LAB — TSH: TSH: 1.81 u[IU]/mL (ref 0.35–4.50)

## 2016-09-07 LAB — BASIC METABOLIC PANEL
BUN: 14 mg/dL (ref 6–23)
CO2: 26 mEq/L (ref 19–32)
Calcium: 9.5 mg/dL (ref 8.4–10.5)
Chloride: 106 mEq/L (ref 96–112)
Creatinine, Ser: 0.95 mg/dL (ref 0.40–1.50)
GFR: 86.59 mL/min (ref >60.00)
GLUCOSE: 98 mg/dL (ref 70–99)
POTASSIUM: 4 meq/L (ref 3.5–5.1)
Sodium: 143 mEq/L (ref 135–145)

## 2016-09-07 LAB — HEMOGLOBIN A1C: HEMOGLOBIN A1C: 5.7 % (ref 4.6–6.5)

## 2016-09-07 NOTE — Progress Notes (Signed)
Pre visit review using our clinic review tool, if applicable. No additional management support is needed unless otherwise documented below in the visit note. 

## 2016-09-07 NOTE — Progress Notes (Signed)
   Subjective:    Patient ID: Jacob Crawford, male    DOB: 1956/04/25, 60 y.o.   MRN: [id number]  HPI CPE- UTD on Tdap, flu, colonoscopy.  No concerns today.  Pt is trying to walk more regularly.   Review of Systems Patient reports no vision/hearing changes, anorexia, fever ,adenopathy, persistant/recurrent hoarseness, swallowing issues, chest pain, palpitations, edema, persistant/recurrent cough, hemoptysis, dyspnea (rest,exertional, paroxysmal nocturnal), gastrointestinal  bleeding (melena, rectal bleeding), abdominal pain, excessive heart burn, GU symptoms (dysuria, hematuria, voiding/incontinence issues) syncope, focal weakness, memory loss, numbness & tingling, skin/hair/nail changes, depression, anxiety, abnormal bruising/bleeding, musculoskeletal symptoms/signs.     Objective:   Physical Exam General Appearance:    Alert, cooperative, no distress, appears stated age.  obese  Head:    Normocephalic, without obvious abnormality, atraumatic  Eyes:    PERRL, conjunctiva/corneas clear, EOM's intact, fundi    benign, both eyes       Ears:    Normal TM's and external ear canals, both ears  Nose:   Nares normal, septum midline, mucosa normal, no drainage   or sinus tenderness  Throat:   Lips, mucosa, and tongue normal; teeth and gums normal  Neck:   Supple, symmetrical, trachea midline, no adenopathy;       thyroid:  No enlargement/tenderness/nodules  Back:     Symmetric, no curvature, ROM normal, no CVA tenderness  Lungs:     Clear to auscultation bilaterally, respirations unlabored  Chest wall:    No tenderness or deformity  Heart:    Regular rate and rhythm, S1 and S2 normal, no murmur, rub   or gallop  Abdomen:     Soft, non-tender, bowel sounds active all four quadrants,    no masses, no organomegaly  Genitalia:    Deferred at pt's request  Rectal:    Extremities:   Extremities normal, atraumatic, no cyanosis or edema  Pulses:   2+ and symmetric all extremities  Skin:   Skin  color, texture, turgor normal, no rashes or lesions  Lymph nodes:   Cervical, supraclavicular, and axillary nodes normal  Neurologic:   CNII-XII intact. Normal strength, sensation and reflexes      throughout          Assessment & Plan:

## 2016-09-07 NOTE — Patient Instructions (Addendum)
Follow up in 6 months to recheck BP and weight loss progress We'll notify you of your lab results and make any changes if needed Continue to work on healthy diet and regular exercise- you can do it! Call with any questions or concerns Happy Halloween!!!

## 2016-09-07 NOTE — Assessment & Plan Note (Signed)
Pt's PE WNL w/ exception of obesity.  UTD on colonoscopy, immunizations.  Check labs.  Anticipatory guidance provided.

## 2016-09-09 ENCOUNTER — Other Ambulatory Visit: Payer: Self-pay | Admitting: Family Medicine

## 2016-09-09 DIAGNOSIS — R7989 Other specified abnormal findings of blood chemistry: Secondary | ICD-10-CM

## 2016-09-09 DIAGNOSIS — R945 Abnormal results of liver function studies: Secondary | ICD-10-CM

## 2016-12-02 ENCOUNTER — Encounter: Payer: Self-pay | Admitting: General Practice

## 2016-12-02 ENCOUNTER — Encounter: Payer: Self-pay | Admitting: Family Medicine

## 2016-12-15 ENCOUNTER — Encounter: Payer: Self-pay | Admitting: Family Medicine

## 2016-12-15 DIAGNOSIS — I10 Essential (primary) hypertension: Secondary | ICD-10-CM

## 2016-12-15 MED ORDER — AMLODIPINE BESYLATE 5 MG PO TABS: 5.0000 mg | ORAL_TABLET | Freq: Every day | ORAL | 3 refills | Status: DC

## 2016-12-15 MED ORDER — VALSARTAN 160 MG PO TABS: 160.0000 mg | ORAL_TABLET | Freq: Every day | ORAL | 3 refills | Status: DC

## 2017-03-01 ENCOUNTER — Encounter: Payer: Self-pay | Admitting: Family Medicine

## 2017-03-01 ENCOUNTER — Ambulatory Visit (INDEPENDENT_AMBULATORY_CARE_PROVIDER_SITE_OTHER): Payer: Managed Care, Other (non HMO) | Admitting: Family Medicine

## 2017-03-01 VITALS — BP 123/83 | HR 76 | Temp 98.1°F | Resp 16 | Ht 63.0 in | Wt 252.4 lb

## 2017-03-01 DIAGNOSIS — I10 Essential (primary) hypertension: Secondary | ICD-10-CM | POA: Diagnosis not present

## 2017-03-01 DIAGNOSIS — J301 Allergic rhinitis due to pollen: Secondary | ICD-10-CM | POA: Diagnosis not present

## 2017-03-01 DIAGNOSIS — J309 Allergic rhinitis, unspecified: Secondary | ICD-10-CM

## 2017-03-01 HISTORY — DX: Allergic rhinitis, unspecified: J30.9

## 2017-03-01 HISTORY — DX: Allergic rhinitis due to pollen: J30.1

## 2017-03-01 LAB — CBC WITH DIFFERENTIAL/PLATELET
Basophils Absolute: 0 10*3/uL (ref 0.0–0.1)
Basophils Relative: 0.7 % (ref 0.0–3.0)
Eosinophils Absolute: 0.1 10*3/uL (ref 0.0–0.7)
Eosinophils Relative: 2 % (ref 0.0–5.0)
HEMATOCRIT: 49.4 % (ref 39.0–52.0)
Hemoglobin: 16.7 g/dL (ref 13.0–17.0)
Lymphocytes Relative: 27.6 % (ref 12.0–46.0)
Lymphs Abs: 1.3 10*3/uL (ref 0.7–4.0)
MCHC: 33.7 g/dL (ref 30.0–36.0)
MCV: 92.6 fl (ref 78.0–100.0)
Monocytes Absolute: 0.4 10*3/uL (ref 0.1–1.0)
Monocytes Relative: 8.5 % (ref 3.0–12.0)
NEUTROS ABS: 2.8 10*3/uL (ref 1.4–7.7)
NEUTROS PCT: 61.2 % (ref 43.0–77.0)
Platelets: 181 10*3/uL (ref 150.0–400.0)
RBC: 5.34 Mil/uL (ref 4.22–5.81)
RDW: 14 % (ref 11.5–15.5)
WBC: 4.6 10*3/uL (ref 4.0–10.5)

## 2017-03-01 LAB — BASIC METABOLIC PANEL
BUN: 13 mg/dL (ref 6–23)
CALCIUM: 9.4 mg/dL (ref 8.4–10.5)
CO2: 27 meq/L (ref 19–32)
Chloride: 107 mEq/L (ref 96–112)
Creatinine, Ser: 0.84 mg/dL (ref 0.40–1.50)
GFR: 99.64 mL/min (ref >60.00)
Glucose, Bld: 102 mg/dL — ABNORMAL HIGH (ref 70–99)
Potassium: 4.5 mEq/L (ref 3.5–5.1)
Sodium: 141 mEq/L (ref 135–145)

## 2017-03-01 NOTE — Patient Instructions (Signed)
Schedule your complete physical in 6 months We'll notify you of your lab results and make any changes if needed Continue to work on healthy diet and regular exercise- you are doing great! We'll call you with your Dr Shary Decamp appt for ongoing weight management Add Claritin or Zyrtec to your daily nasal spray Drink plenty of fluids Call with any questions or concerns Hang in there!  You're doing great!

## 2017-03-01 NOTE — Assessment & Plan Note (Signed)
Chronic problem.  Pt is now exercising regularly but has not seen a change in weight.  Pt does notice a difference in the way his clothes fit.  Will refer to Dr Leafy Ro for ongoing management.  Will follow.

## 2017-03-01 NOTE — Progress Notes (Signed)
   Subjective:    Patient ID: IBAN UTZ, male    DOB: 10/12/1956, 61 y.o.   MRN: [id number]  HPI HTN- chronic problem, well controlled today on Amlodipine and Valsartan.    Obesity- chronic problem.  Pt has been exercising regularly on an exercise bike since Jan but has not lost any weight.  Riding 30 minutes, 5x/week.  Pt has not noticed a change on the scale but has noticed improvement in the way his pants fit.  Sore throat- sxs started ~2-3 days ago.  Using Flonase daily.  Not taking antihistamine daily.  No fever.  Mild cough.   Review of Systems For ROS see HPI     Objective:   Physical Exam  Constitutional: He appears well-developed and well-nourished. No distress.  obese  HENT:  Head: Normocephalic and atraumatic.  No TTP over sinuses + turbinate edema + PND TMs normal bilaterally  Eyes: Conjunctivae and EOM are normal. Pupils are equal, round, and reactive to light.  Neck: Normal range of motion. Neck supple.  Cardiovascular: Normal rate, regular rhythm and normal heart sounds.   Pulmonary/Chest: Effort normal and breath sounds normal. No respiratory distress. He has no wheezes.  Lymphadenopathy:    He has no cervical adenopathy.  Skin: Skin is warm and dry.  Vitals reviewed.         Assessment & Plan:

## 2017-03-01 NOTE — Assessment & Plan Note (Signed)
New.  This is cause of pt's sore throat.  Start daily antihistamine.  Reviewed supportive care and red flags that should prompt return.  Pt expressed understanding and is in agreement w/ plan.

## 2017-03-01 NOTE — Progress Notes (Signed)
Pre visit review using our clinic review tool, if applicable. No additional management support is needed unless otherwise documented below in the visit note. 

## 2017-03-01 NOTE — Assessment & Plan Note (Signed)
Chronic problem, well controlled today.  Asymptomatic.  Check labs.  No anticipated med changes.  Will follow. 

## 2017-04-28 ENCOUNTER — Other Ambulatory Visit: Payer: Self-pay | Admitting: Family Medicine

## 2017-04-28 DIAGNOSIS — I10 Essential (primary) hypertension: Secondary | ICD-10-CM

## 2017-04-28 NOTE — Telephone Encounter (Signed)
Medication filled to pharmacy as requested.   

## 2017-04-29 ENCOUNTER — Encounter (INDEPENDENT_AMBULATORY_CARE_PROVIDER_SITE_OTHER): Payer: Self-pay | Admitting: Family Medicine

## 2017-06-09 ENCOUNTER — Telehealth: Payer: Self-pay | Admitting: Family Medicine

## 2017-06-09 MED ORDER — LOSARTAN POTASSIUM 100 MG PO TABS: 100.0000 mg | ORAL_TABLET | Freq: Every day | ORAL | 1 refills | Status: DC

## 2017-06-09 NOTE — Telephone Encounter (Signed)
Medication filled to pharmacy as requested.  Called and informed pt.

## 2017-06-09 NOTE — Telephone Encounter (Signed)
Pt states that due to recall on BP medication pt is asking that a new Rx be called into [facility].

## 2017-06-09 NOTE — Telephone Encounter (Signed)
Please advise 

## 2017-06-09 NOTE — Addendum Note (Signed)
Addended by: Davis Gourd on: 06/09/2017 12:45 PM   Modules accepted: Orders

## 2017-06-09 NOTE — Telephone Encounter (Signed)
Ok for Losartan 100mg  daily, #90, 1 refill

## 2017-07-16 ENCOUNTER — Telehealth: Payer: Self-pay | Admitting: Family Medicine

## 2017-07-16 NOTE — Telephone Encounter (Signed)
Ok to move to December and we will fill meds as needed.  Please thank him for understanding!

## 2017-07-16 NOTE — Telephone Encounter (Signed)
I called pt to reschedule his CPE appt due to [facility] being out of office on 10/31, pt request that he have a morning or late afternoon appt. Unfortunately the next available appt would be until late Dec. If scheduled then, pt will need a refill on BP meds. Is this ok to schedule in Dec?

## 2017-07-16 NOTE — Telephone Encounter (Signed)
Pt has been rescheduled for 12/27 and will call us when he needs a refill on medications.

## 2017-09-08 ENCOUNTER — Encounter: Payer: Managed Care, Other (non HMO) | Admitting: Family Medicine

## 2017-11-04 ENCOUNTER — Encounter: Payer: Managed Care, Other (non HMO) | Admitting: Family Medicine

## 2017-11-04 ENCOUNTER — Telehealth: Payer: Self-pay | Admitting: Family Medicine

## 2017-11-04 NOTE — Telephone Encounter (Signed)
Correction: Amlodipine

## 2017-11-04 NOTE — Telephone Encounter (Signed)
Spoke to pt to reschedule CPE. Pt ok with rescheduling, concerned about heart med perscription running out.  (amlodeprine?) Would like to have a refill sent if possible.

## 2017-11-05 MED ORDER — AMLODIPINE BESYLATE 5 MG PO TABS: 5.0000 mg | ORAL_TABLET | Freq: Every day | ORAL | 1 refills | Status: DC

## 2017-11-05 NOTE — Addendum Note (Signed)
Addended by: Katina Dung on: 11/05/2017 01:03 PM   Modules accepted: Orders

## 2017-11-05 NOTE — Telephone Encounter (Signed)
RX has been sent to [facility]

## 2017-11-05 NOTE — Telephone Encounter (Signed)
Ok to send #90, 1 refill of Amlodipine and PLEASE send my apologies for having to cancel

## 2017-11-05 NOTE — Telephone Encounter (Signed)
LM for patient to call to let me know which pharmacy he wants me to send medication.

## 2017-11-11 ENCOUNTER — Other Ambulatory Visit (INDEPENDENT_AMBULATORY_CARE_PROVIDER_SITE_OTHER): Payer: BLUE CROSS/BLUE SHIELD

## 2017-11-11 ENCOUNTER — Encounter: Payer: Self-pay | Admitting: Family Medicine

## 2017-11-11 ENCOUNTER — Ambulatory Visit (INDEPENDENT_AMBULATORY_CARE_PROVIDER_SITE_OTHER): Payer: BLUE CROSS/BLUE SHIELD | Admitting: Family Medicine

## 2017-11-11 VITALS — BP 150/90 | HR 74 | Temp 97.9°F | Ht 63.0 in | Wt 263.4 lb

## 2017-11-11 DIAGNOSIS — Z0000 Encounter for general adult medical examination without abnormal findings: Secondary | ICD-10-CM

## 2017-11-11 DIAGNOSIS — Z0001 Encounter for general adult medical examination with abnormal findings: Secondary | ICD-10-CM | POA: Diagnosis not present

## 2017-11-11 DIAGNOSIS — Z1159 Encounter for screening for other viral diseases: Secondary | ICD-10-CM | POA: Diagnosis not present

## 2017-11-11 DIAGNOSIS — I10 Essential (primary) hypertension: Secondary | ICD-10-CM

## 2017-11-11 DIAGNOSIS — R7309 Other abnormal glucose: Secondary | ICD-10-CM | POA: Diagnosis not present

## 2017-11-11 DIAGNOSIS — F418 Other specified anxiety disorders: Secondary | ICD-10-CM | POA: Diagnosis not present

## 2017-11-11 LAB — CBC WITH DIFFERENTIAL/PLATELET
Basophils Absolute: 0 10*3/uL (ref 0.0–0.1)
Basophils Relative: 0.8 % (ref 0.0–3.0)
EOS PCT: 1.7 % (ref 0.0–5.0)
Eosinophils Absolute: 0.1 10*3/uL (ref 0.0–0.7)
HEMATOCRIT: 50.2 % (ref 39.0–52.0)
Hemoglobin: 16.6 g/dL (ref 13.0–17.0)
LYMPHS ABS: 1.4 10*3/uL (ref 0.7–4.0)
LYMPHS PCT: 27.3 % (ref 12.0–46.0)
MCHC: 33.1 g/dL (ref 30.0–36.0)
MCV: 93.7 fl (ref 78.0–100.0)
Monocytes Absolute: 0.4 10*3/uL (ref 0.1–1.0)
Monocytes Relative: 7.6 % (ref 3.0–12.0)
NEUTROS ABS: 3.2 10*3/uL (ref 1.4–7.7)
Neutrophils Relative %: 62.6 % (ref 43.0–77.0)
PLATELETS: 211 10*3/uL (ref 150.0–400.0)
RBC: 5.36 Mil/uL (ref 4.22–5.81)
RDW: 13.4 % (ref 11.5–15.5)
WBC: 5.2 10*3/uL (ref 4.0–10.5)

## 2017-11-11 LAB — COMPREHENSIVE METABOLIC PANEL
ALT: 54 U/L — AB (ref 0–53)
AST: 26 U/L (ref 0–37)
Albumin: 4.4 g/dL (ref 3.5–5.2)
Alkaline Phosphatase: 69 U/L (ref 39–117)
BILIRUBIN TOTAL: 0.7 mg/dL (ref 0.2–1.2)
BUN: 15 mg/dL (ref 6–23)
CO2: 27 meq/L (ref 19–32)
Calcium: 9.3 mg/dL (ref 8.4–10.5)
Chloride: 106 mEq/L (ref 96–112)
Creatinine, Ser: 0.89 mg/dL (ref 0.40–1.50)
GFR: 92.98 mL/min (ref >60.00)
Glucose, Bld: 112 mg/dL — ABNORMAL HIGH (ref 70–99)
POTASSIUM: 4 meq/L (ref 3.5–5.1)
Sodium: 142 mEq/L (ref 135–145)
TOTAL PROTEIN: 7 g/dL (ref 6.0–8.3)

## 2017-11-11 LAB — HEMOGLOBIN A1C: HEMOGLOBIN A1C: 5.8 % (ref 4.6–6.5)

## 2017-11-11 LAB — LIPID PANEL
Cholesterol: 148 mg/dL (ref 0–200)
HDL: 36.7 mg/dL — AB (ref >39.00)
LDL Cholesterol: 89 mg/dL (ref 0–99)
NonHDL: 111.5
Total CHOL/HDL Ratio: 4
Triglycerides: 113 mg/dL (ref 0.0–149.0)
VLDL: 22.6 mg/dL (ref 0.0–40.0)

## 2017-11-11 LAB — PSA: PSA: 1.42 ng/mL (ref 0.10–4.00)

## 2017-11-11 MED ORDER — SERTRALINE HCL 25 MG PO TABS: 25.0000 mg | ORAL_TABLET | Freq: Every day | ORAL | 1 refills | Status: DC

## 2017-11-11 MED ORDER — LOSARTAN POTASSIUM 100 MG PO TABS: 100.0000 mg | ORAL_TABLET | Freq: Every day | ORAL | 1 refills | Status: DC

## 2017-11-11 NOTE — Progress Notes (Signed)
Subjective  Chief Complaint  Patient presents with  . Annual Exam    Fasting   HPI: Jacob Crawford is a 62 y.o. male who presents to [place] at [facility] today for a Male Wellness Visit. He is a patient of Dr. Virgil Benedict. He also has concerns regarding anxiety and needs HTN f/u.  Wellness Visit: annual visit with health maintenance review and exam    Morbid obesity: has gained weight since losing job in March, up about 11 pounds. Now working again, but hasn't restarted diet / exercise. Denies sxs of hyperglycemia.   Due for repeat colonoscopy in April. Should be on recall list. Adenomatous polyps in 2014. I reviewed.   Stress levels are high. See below.   Acute problem visit:   Anxiety: reports since being fired in March (first time ever losing job) he has struggled with anxiety. Has racing thoughts, can't unwind, feels uptight but denies panic attack sxs of palpitations, sob, impending doom. Has used his wife's "anxiety" medication x 2 with some relief. Denies depression. Is working again but can't seem to quit worrying that something will go wrong again or that he may lose his job again. Sleeps well. No sxs of depression. Never has had mood disorder or been on meds. Also living with stress from wife's multiple chronic illnesses; she is trying to get disability now. His son is also on anxiety meds: zoloft and doing well. He would like help with this but is limited by difficulty getting time off from new job for follow up.   HTN: reports has checked bp outside of office on occasion and it is always normal. Took meds this am. Is compliant. No sob, cp, Le edema.  Lifestyle: Body mass index is 46.66 kg/m. Wt Readings from Last 3 Encounters:  11/11/17 263 lb 6.1 oz (119.5 kg)  03/01/17 252 lb 6 oz (114.5 kg)  09/07/16 252 lb (114.3 kg)   Diet: general Exercise: rarely, bicycling  Patient Active Problem List   Diagnosis Date Noted  . Allergic rhinitis 03/01/2017    . Skin lesion of face 09/05/2015  . Upper airway cough syndrome 06/04/2015  . Solitary pulmonary nodule 06/04/2015  . Hematuria 05/20/2015  . Maxillary sinusitis, acute 06/29/2014  . Abdominal pain, epigastric 11/01/2013  . OSA (obstructive sleep apnea) 03/30/2013  . URI (upper respiratory infection) 01/02/2013  . Routine general medical examination at a health care facility 12/27/2012  . Essential hypertension 12/20/2012  . Lipoma of back 12/20/2012  . Meralgia paresthetica of left side 12/20/2012  . Morbid obesity ([place]) 12/20/2012   Health Maintenance  Topic Date Due  . Hepatitis C Screening  1956/04/09  . HIV Screening  11/11/2018 (Originally 11/01/1973)  . COLONOSCOPY  02/17/2018  . TETANUS/TDAP  06/03/2021  . INFLUENZA VACCINE  Completed   Immunization History  Administered Date(s) Administered  . Influenza,inj,Quad PF,6+ Mos 08/26/2014, 08/22/2015, 08/28/2016, 08/07/2017  . Influenza-Unspecified 08/09/2013  . Tdap 06/04/2011   We updated and reviewed the patient's past history in detail and it is documented below. Allergies: Patient is allergic to enalapril maleate; codeine; and hydrochlorothiazide. Past Medical History  has a past medical history of H/O seasonal allergies and Hypertension. Past Surgical History  has a past surgical history that includes Fracture surgery (1965). Social History Patient  reports that  has never smoked. he has never used smokeless tobacco. He reports that he does not drink alcohol or use drugs. Family History Patient family history includes AAA (abdominal aortic aneurysm) in his  father; Arthritis in his maternal grandfather, maternal grandmother, paternal grandfather, and paternal grandmother; COPD in his father; Cancer in his father; Colon cancer in his paternal grandfather; Heart disease in his cousin, father, and maternal grandfather; Hypertension in his father and mother; Mental illness (age of onset: 65) in his son; Sleep apnea in his  mother; Stroke in his mother. Review of Systems: Constitutional: negative for fever or malaise Ophthalmic: negative for photophobia, double vision or loss of vision Cardiovascular: negative for chest pain, dyspnea on exertion, or new LE swelling Respiratory: negative for SOB or persistent cough Gastrointestinal: negative for abdominal pain, change in bowel habits or melena Genitourinary: negative for dysuria or gross hematuria Musculoskeletal: negative for new gait disturbance or muscular weakness Integumentary: negative for new or persistent rashes, no breast lumps Neurological: negative for TIA or stroke symptoms Psychiatric: negative for SI or delusions, no manic sxs Allergic/Immunologic: negative for hives  Patient Care Team    Relationship Specialty Notifications Start End  Midge Minium, MD PCP - General Family Medicine  12/20/12   Sable Feil, MD Consulting Physician Gastroenterology  09/05/15   Tanda Rockers, MD Consulting Physician Pulmonary Disease  09/05/15   Christy Sartorius, MD Referring Physician Urology  09/05/15    Objective  Vitals: BP (!) 150/90 (BP Location: Left Arm, Patient Position: Sitting, Cuff Size: Large)   Pulse 74   Temp 97.9 F (36.6 C) (Oral)   Ht 5\' 3"  (1.6 m)   Wt 263 lb 6.1 oz (119.5 kg)   SpO2 95%   BMI 46.66 kg/m  General:  Well developed, well nourished, no acute distress , obese Psych:  Alert and orientedx3,normal mood and affect except for mild anxiety, nl speech HEENT:  Normocephalic, atraumatic, non-icteric sclera, PERRL, oropharynx is clear without mass or exudate, supple neck without adenopathy, mass or thyromegaly Cardiovascular:  Normal S1, S2, RRR without gallop, rub or murmur, nondisplaced PMI, +2 distal pulses in bilateral upper and lower extremities. Respiratory:  Good breath sounds bilaterally, CTAB with normal respiratory effort Gastrointestinal: normal bowel sounds, soft, non-tender, no noted masses. No HSM MSK: no  deformities, contusions. Joints are without erythema or swelling. Spine and CVA region are nontender Skin:  Warm, no rashes or suspicious lesions noted Neurologic:    Mental status is normal. CN 2-11 are normal. Gross motor and sensory exams are normal. Stable gait. No tremor GU: No inguinal hernias or adenopathy are appreciated bilaterally  Assessment  1. Annual physical exam   2. Essential hypertension   3. Morbid obesity ([place])   4. Situational anxiety   5. Need for hepatitis C screening test      Plan  Male Wellness Visit:  Age appropriate Health Maintenance and Prevention measures were discussed with patient. Included topics are cancer screening recommendations, ways to keep healthy (see AVS) including dietary and exercise recommendations, regular eye and dental care, use of seat belts, and avoidance of moderate alcohol use and tobacco use.   BMI: discussed patient's BMI and encouraged positive lifestyle modifications to help get to or maintain a target BMI. Needs to start weight loss program again. Anxiety is a barrier.   HM needs and immunizations were addressed and ordered. See below for orders. See HM and immunization section for updates. UTD  Routine labs and screening tests ordered including cmp, cbc and lipids where appropriate.  Discussed recommendations regarding Vit D and calcium supplementation (see AVS)  Acute problem/chronic f/u visit:   Anxiety: situational but problematic and negatively affecting  well being. Counseling done. Recommend starting low dose zoloft. Educated on appropriate use and expectations. See AVS. rec f/u in 6-12 weeks. Can try to titrate up dose via Mychart due to his difficulty getting off work at this time. Avoid using wife's meds. Avoid benzo's due to [employer] license and job requirements.   HTN: uncontrolled today. rec checking outside of office. Adjust meds up at f/u visit if remains elevated. Pt is hesitant to make changes today. Check labs.    Follow up: Return in about 3 months (around 02/09/2018) for follow up Hypertension and anxiety.   Commons side effects, risks, benefits, and alternatives for medications and treatment plan prescribed today were discussed, and the patient expressed understanding of the given instructions. Patient is instructed to call or message via MyChart if he/she has any questions or concerns regarding our treatment plan. No barriers to understanding were identified. We discussed Red Flag symptoms and signs in detail. Patient expressed understanding regarding what to do in case of urgent or emergency type symptoms.   Medication list was reconciled, printed and provided to the patient in AVS. Patient instructions and summary information was reviewed with the patient as documented in the AVS. This note was prepared with assistance of Dragon voice recognition software. Occasional wrong-word or sound-a-like substitutions may have occurred due to the inherent limitations of voice recognition software  Orders Placed This Encounter  Procedures  . CBC with Differential/Platelet  . Comprehensive metabolic panel  . Lipid panel  . Hepatitis C antibody  . PSA  . POCT urinalysis dipstick   Meds ordered this encounter  Medications  . losartan (COZAAR) 100 MG tablet    Sig: Take 1 tablet (100 mg total) by mouth daily.    Dispense:  90 tablet    Refill:  1    This is to take the place of pt valsartan due to recall.  . sertraline (ZOLOFT) 25 MG tablet    Sig: Take 1 tablet (25 mg total) by mouth daily.    Dispense:  90 tablet    Refill:  1

## 2017-11-11 NOTE — Patient Instructions (Signed)
Please return in 3 months to assess your medications for anxiety and recheck your blood pressure.  If you have any questions or concerns, please don't hesitate to send me a message via MyChart or call the office at [phone]. Thank you for visiting with Jacob Crawford today! It's our pleasure caring for you.  Depression Medications:  Taking the medicine as directed and not missing any doses is one of the best things you can do to treat your depression.  Here are some things to keep in mind:  1) Side effects (stomach upset, some increased anxiety) may happen before you notice a benefit.  These side effects typically go away over time. 2) Changes to your dose of medicine or a change in medication all together is sometimes necessary 3) Most people need to be on medication at least 6-12 months 4) Many people will notice an improvement within two weeks but the full effect of the medication can take up to 4-6 weeks 5) Stopping the medication when you start feeling better often results in a return of symptoms 6) If you start having thoughts of hurting yourself or others after starting this medicine, please call the office immediately at [phone].     Please do these things to maintain good health!   Exercise at least 30-45 minutes a day,  4-5 days a week.   Eat a low-fat diet with lots of fruits and vegetables, up to 7-9 servings per day.  Drink plenty of water daily. Try to drink 8 8oz glasses per day.  Seatbelts can save your life. Always wear your seatbelt.  Place Smoke Detectors on every level of your home and check batteries every year.  Eye Doctor - have an eye exam every 1-2 years  Safe sex - use condoms to protect yourself from STDs if you could be exposed to these types of infections.  Avoid heavy alcohol use. If you drink, keep it to less than 2 drinks/day and not every day.  [place].  Choose someone you trust that could speak for you if you became unable to  speak for yourself.  Depression is common in our stressful world.If you're feeling down or losing interest in things you normally enjoy, please come in for a visit.   Living With Anxiety After being diagnosed with an anxiety disorder, you may be relieved to know why you have felt or behaved a certain way. It is natural to also feel overwhelmed about the treatment ahead and what it will mean for your life. With care and support, you can manage this condition and recover from it. How to cope with anxiety Dealing with stress Stress is your body's reaction to life changes and events, both good and bad. Stress can last just a few hours or it can be ongoing. Stress can play a major role in anxiety, so it is important to learn both how to cope with stress and how to think about it differently. Talk with your health care provider or a counselor to learn more about stress reduction. He or she may suggest some stress reduction techniques, such as:  Music therapy. This can include creating or listening to music that you enjoy and that inspires you.  Mindfulness-based meditation. This involves being aware of your normal breaths, rather than trying to control your breathing. It can be done while sitting or walking.  Centering prayer. This is a kind of meditation that involves focusing on a word, phrase, or sacred image that is meaningful  to you and that brings you peace.  Deep breathing. To do this, expand your stomach and inhale slowly through your nose. Hold your breath for 3-5 seconds. Then exhale slowly, allowing your stomach muscles to relax.  Self-talk. This is a skill where you identify thought patterns that lead to anxiety reactions and correct those thoughts.  Muscle relaxation. This involves tensing muscles then relaxing them.  Choose a stress reduction technique that fits your lifestyle and personality. Stress reduction techniques take time and practice. Set aside 5-15 minutes a day to do them.  Therapists can offer training in these techniques. The training may be covered by some insurance plans. Other things you can do to manage stress include:  Keeping a stress diary. This can help you learn what triggers your stress and ways to control your response.  Thinking about how you respond to certain situations. You may not be able to control everything, but you can control your reaction.  Making time for activities that help you relax, and not feeling guilty about spending your time in this way.  Therapy combined with coping and stress-reduction skills provides the best chance for successful treatment. Medicines Medicines can help ease symptoms. Medicines for anxiety include:  Anti-anxiety drugs.  Antidepressants.  Beta-blockers.  Medicines may be used as the main treatment for anxiety disorder, along with therapy, or if other treatments are not working. Medicines should be prescribed by a health care provider. Relationships Relationships can play a big part in helping you recover. Try to spend more time connecting with trusted friends and family members. Consider going to couples counseling, taking family education classes, or going to family therapy. Therapy can help you and others better understand the condition. How to recognize changes in your condition Everyone has a different response to treatment for anxiety. Recovery from anxiety happens when symptoms decrease and stop interfering with your daily activities at home or work. This may mean that you will start to:  Have better concentration and focus.  Sleep better.  Be less irritable.  Have more energy.  Have improved memory.  It is important to recognize when your condition is getting worse. Contact your health care provider if your symptoms interfere with home or work and you do not feel like your condition is improving. Where to find help and support: You can get help and support from these sources:  Self-help  groups.  Online and [employer].  A trusted spiritual leader.  Couples counseling.  Family education classes.  Family therapy.  Follow these instructions at home:  Eat a healthy diet that includes plenty of vegetables, fruits, whole grains, low-fat dairy products, and lean protein. Do not eat a lot of foods that are high in solid fats, added sugars, or salt.  Exercise. Most adults should do the following: ? Exercise for at least 150 minutes each week. The exercise should increase your heart rate and make you sweat (moderate-intensity exercise). ? Strengthening exercises at least twice a week.  Cut down on caffeine, tobacco, alcohol, and other potentially harmful substances.  Get the right amount and quality of sleep. Most adults need 7-9 hours of sleep each night.  Make choices that simplify your life.  Take over-the-counter and prescription medicines only as told by your health care provider.  Avoid caffeine, alcohol, and certain over-the-counter cold medicines. These may make you feel worse. Ask your pharmacist which medicines to avoid.  Keep all follow-up visits as told by your health care provider. This is important. Questions  to ask your health care provider  Would I benefit from therapy?  How often should I follow up with a health care provider?  How long do I need to take medicine?  Are there any long-term side effects of my medicine?  Are there any alternatives to taking medicine? Contact a health care provider if:  You have a hard time staying focused or finishing daily tasks.  You spend many hours a day feeling worried about everyday life.  You become exhausted by worry.  You start to have headaches, feel tense, or have nausea.  You urinate more than normal.  You have diarrhea. Get help right away if:  You have a racing heart and shortness of breath.  You have thoughts of hurting yourself or others. If you ever feel like you may hurt  yourself or others, or have thoughts about taking your own life, get help right away. You can go to your nearest emergency department or call:  Your local emergency services (911 in the [place]  A suicide crisis helpline, such as the [place] at [phone]. This is open 24-hours a day.  Summary  Taking steps to deal with stress can help calm you.  Medicines cannot cure anxiety disorders, but they can help ease symptoms.  Family, friends, and partners can play a big part in helping you recover from an anxiety disorder. This information is not intended to replace advice given to you by your health care provider. Make sure you discuss any questions you have with your health care provider. Document Released: 10/20/2016 Document Revised: 10/20/2016 Document Reviewed: 10/20/2016 Elsevier Interactive Patient Education  [employer].

## 2017-11-12 ENCOUNTER — Encounter: Payer: Self-pay | Admitting: General Practice

## 2017-11-12 LAB — HEPATITIS C ANTIBODY
HEP C AB: NONREACTIVE
SIGNAL TO CUT-OFF: 0.01 (ref <1.00)

## 2018-01-26 DIAGNOSIS — S6991XA Unspecified injury of right wrist, hand and finger(s), initial encounter: Secondary | ICD-10-CM | POA: Diagnosis not present

## 2018-01-26 DIAGNOSIS — W312XXA Contact with powered woodworking and forming machines, initial encounter: Secondary | ICD-10-CM | POA: Diagnosis not present

## 2018-01-26 DIAGNOSIS — S68110A Complete traumatic metacarpophalangeal amputation of right index finger, initial encounter: Secondary | ICD-10-CM | POA: Diagnosis not present

## 2018-01-26 DIAGNOSIS — S68120A Partial traumatic metacarpophalangeal amputation of right index finger, initial encounter: Secondary | ICD-10-CM | POA: Diagnosis not present

## 2018-01-26 DIAGNOSIS — Y999 Unspecified external cause status: Secondary | ICD-10-CM | POA: Diagnosis not present

## 2018-01-26 DIAGNOSIS — S61011A Laceration without foreign body of right thumb without damage to nail, initial encounter: Secondary | ICD-10-CM | POA: Diagnosis not present

## 2018-02-01 DIAGNOSIS — S61111D Laceration without foreign body of right thumb with damage to nail, subsequent encounter: Secondary | ICD-10-CM | POA: Diagnosis not present

## 2018-02-01 DIAGNOSIS — S68620D Partial traumatic transphalangeal amputation of right index finger, subsequent encounter: Secondary | ICD-10-CM | POA: Diagnosis not present

## 2018-02-07 ENCOUNTER — Encounter: Payer: Self-pay | Admitting: Family Medicine

## 2018-02-07 ENCOUNTER — Ambulatory Visit: Payer: BLUE CROSS/BLUE SHIELD | Admitting: Family Medicine

## 2018-02-07 ENCOUNTER — Encounter: Payer: Self-pay | Admitting: General Practice

## 2018-02-07 ENCOUNTER — Other Ambulatory Visit: Payer: Self-pay

## 2018-02-07 VITALS — BP 151/92 | HR 79 | Temp 98.1°F | Resp 16 | Ht 63.0 in | Wt 262.2 lb

## 2018-02-07 DIAGNOSIS — F32A Depression, unspecified: Secondary | ICD-10-CM

## 2018-02-07 DIAGNOSIS — I10 Essential (primary) hypertension: Secondary | ICD-10-CM

## 2018-02-07 DIAGNOSIS — F329 Major depressive disorder, single episode, unspecified: Secondary | ICD-10-CM

## 2018-02-07 DIAGNOSIS — F419 Anxiety disorder, unspecified: Secondary | ICD-10-CM

## 2018-02-07 HISTORY — DX: Depression, unspecified: F32.A

## 2018-02-07 HISTORY — DX: Anxiety disorder, unspecified: F41.9

## 2018-02-07 MED ORDER — AMLODIPINE BESYLATE 10 MG PO TABS: 10.0000 mg | ORAL_TABLET | Freq: Every day | ORAL | 1 refills | Status: DC

## 2018-02-07 NOTE — Assessment & Plan Note (Signed)
Improved since starting low dose Sertraline.  No changes at this time.  Will follow.

## 2018-02-07 NOTE — Patient Instructions (Addendum)
Follow up in 4-6 weeks to recheck BP INCREASE the Amlodipine to 10mg  daily- 2 of what you have at home and 1 of the new prescription CONTINUE the Losartan daily Try and work on healthy diet and regular exercise- you can do it!! Call with any questions or concerns CONGRATS on the grandbaby!!!

## 2018-02-07 NOTE — Assessment & Plan Note (Signed)
Ongoing issue for pt.  BMI is 46.5.  Stressed need for healthy diet and regular exercise.  Pt is aware and plans to do better.  Will follow.

## 2018-02-07 NOTE — Progress Notes (Signed)
   Subjective:    Patient ID: Jacob Crawford, male    DOB: [birth year], 62 y.o.   MRN: [id number]  HPI HTN- chronic problem.  On Losartan 100mg  daily and Amlodipine 5mg  daily.  BP is not well controlled at 151/92.  No CP, SOB, HAs, visual changes, edema.  Depression- pt was started on Sertraline 25mg  at January visit.  Pt had lost his job 'which was real stressful'.  Feels med is 'helping a lot'.  Feels anxiety has lessened and is 'more even keel'.    Obesity- ongoing issue.  BMI is 46.5.  Pt admits to recent stress eating and lack of exercise due to anxiety/depression.   Review of Systems For ROS see HPI     Objective:   Physical Exam  Constitutional: He is oriented to person, place, and time. He appears well-developed and well-nourished. No distress.  obese  HENT:  Head: Normocephalic and atraumatic.  Eyes: Pupils are equal, round, and reactive to light. Conjunctivae and EOM are normal.  Neck: Normal range of motion. Neck supple. No thyromegaly present.  Cardiovascular: Normal rate, regular rhythm, normal heart sounds and intact distal pulses.  No murmur heard. Pulmonary/Chest: Effort normal and breath sounds normal. No respiratory distress.  Abdominal: Soft. Bowel sounds are normal. He exhibits no distension.  Musculoskeletal: He exhibits no edema.  Lymphadenopathy:    He has no cervical adenopathy.  Neurological: He is alert and oriented to person, place, and time. No cranial nerve deficit.  Skin: Skin is warm and dry.  Psychiatric: He has a normal mood and affect. His behavior is normal.  Vitals reviewed.         Assessment & Plan:

## 2018-02-07 NOTE — Assessment & Plan Note (Signed)
Deteriorated.  Pt's BP is not well controlled but thankfully he is asymptomatic.  Will increase Amlodipine to 10mg  daily and continue Losartan at 100mg  daily to improve BP control.  Pt to check BP at home and report back.  Will follow closely.

## 2018-02-11 DIAGNOSIS — S68620D Partial traumatic transphalangeal amputation of right index finger, subsequent encounter: Secondary | ICD-10-CM | POA: Diagnosis not present

## 2018-02-11 DIAGNOSIS — S61111D Laceration without foreign body of right thumb with damage to nail, subsequent encounter: Secondary | ICD-10-CM | POA: Diagnosis not present

## 2018-02-15 ENCOUNTER — Encounter: Payer: Self-pay | Admitting: Gastroenterology

## 2018-02-16 ENCOUNTER — Encounter: Payer: Self-pay | Admitting: Family Medicine

## 2018-03-18 ENCOUNTER — Telehealth: Payer: Self-pay | Admitting: Family Medicine

## 2018-03-18 DIAGNOSIS — G4733 Obstructive sleep apnea (adult) (pediatric): Secondary | ICD-10-CM

## 2018-03-18 NOTE — Telephone Encounter (Signed)
Copied from [place] [id number]. Topic: Referral - Request >> Mar 18, 2018  4:40 PM Jacob Crawford wrote: Reason for CRM: pt wife called in and stated that pt now has [facility] lot better ins.  He could not afford the cpap before.  He would like [facility] new referral to have the home sleep study and start the process over to get Crawford cpap?     Best number [phone]

## 2018-03-21 NOTE — Addendum Note (Signed)
Addended by: Davis Gourd on: 03/21/2018 10:58 AM   Modules accepted: Orders

## 2018-03-21 NOTE — Telephone Encounter (Signed)
Referral placed.

## 2018-03-21 NOTE — Telephone Encounter (Signed)
Pt has seen [facility] in the past, ok for referral

## 2018-03-21 NOTE — Telephone Encounter (Signed)
Ok for referral?

## 2018-03-25 ENCOUNTER — Ambulatory Visit: Payer: BLUE CROSS/BLUE SHIELD | Admitting: Family Medicine

## 2018-04-26 DIAGNOSIS — K760 Fatty (change of) liver, not elsewhere classified: Secondary | ICD-10-CM | POA: Diagnosis not present

## 2018-04-26 DIAGNOSIS — R109 Unspecified abdominal pain: Secondary | ICD-10-CM | POA: Diagnosis not present

## 2018-04-26 DIAGNOSIS — N200 Calculus of kidney: Secondary | ICD-10-CM | POA: Diagnosis not present

## 2018-04-26 DIAGNOSIS — N201 Calculus of ureter: Secondary | ICD-10-CM | POA: Diagnosis not present

## 2018-04-26 DIAGNOSIS — N281 Cyst of kidney, acquired: Secondary | ICD-10-CM | POA: Diagnosis not present

## 2018-05-03 ENCOUNTER — Other Ambulatory Visit: Payer: Self-pay | Admitting: Family Medicine

## 2018-05-17 ENCOUNTER — Encounter: Payer: Self-pay | Admitting: Family Medicine

## 2018-05-17 ENCOUNTER — Encounter: Payer: Self-pay | Admitting: Pulmonary Disease

## 2018-05-17 ENCOUNTER — Ambulatory Visit: Payer: BLUE CROSS/BLUE SHIELD | Admitting: Pulmonary Disease

## 2018-05-17 VITALS — BP 128/86 | HR 79 | Ht 63.0 in | Wt 263.0 lb

## 2018-05-17 DIAGNOSIS — G4733 Obstructive sleep apnea (adult) (pediatric): Secondary | ICD-10-CM | POA: Diagnosis not present

## 2018-05-17 NOTE — Patient Instructions (Signed)
Will arrange for home sleep study Will call to arrange for follow up after sleep study reviewed  

## 2018-05-17 NOTE — Progress Notes (Signed)
   Subjective:    Patient ID: Jacob Crawford, male    DOB: Oct 03, 1956, 62 y.o.   MRN: [id number]  HPI    Review of Systems  Constitutional: Negative for fever and unexpected weight change.  HENT: Positive for postnasal drip, sinus pressure and sinus pain. Negative for congestion, dental problem, ear pain, nosebleeds, rhinorrhea, sneezing, sore throat and trouble swallowing.   Eyes: Negative for redness and itching.  Respiratory: Positive for cough, choking and shortness of breath. Negative for chest tightness and wheezing.   Cardiovascular: Positive for leg swelling. Negative for palpitations.  Gastrointestinal: Negative for nausea and vomiting.  Genitourinary: Negative for dysuria.  Musculoskeletal: Negative for joint swelling.  Skin: Negative for rash.  Allergic/Immunologic: Positive for environmental allergies. Negative for food allergies and immunocompromised state.  Neurological: Negative for headaches.  Hematological: Does not bruise/bleed easily.  Psychiatric/Behavioral: Negative for dysphoric mood. The patient is nervous/anxious.        Objective:   Physical Exam        Assessment & Plan:

## 2018-05-17 NOTE — Progress Notes (Signed)
[facility]  Chief Complaint  Patient presents with  . sleep consult    Pt referred by Dr. Annye Asa MD. Pt sleeps up right in a chair everynight to sleep. Pt reports waking up gasping for air at times, daytime sleepiness, and choking and coughing every night. Pt use to see [facility] back in 2014 for OSA could not afford machine at the time. Pt has [employer] now, and would like to try cpap machine again.    Constitutional: BP 128/86 (BP Location: Left Arm, Cuff Size: Normal)   Pulse 79   Ht 5\' 3"  (1.6 m)   Wt 263 lb (119.3 kg)   SpO2 95%   BMI 46.59 kg/m   History of Present Illness: Jacob Crawford is a 62 y.o. male with obstructive sleep apnea.  He had a sleep study in 2014.  This showed moderate sleep apnea.  He wasn't able to afford CPAP then.  His sleep issues have continued.  He snores, and will wake up with a cough.  He has to sleep in a recliner.  His mouth gets dry at night.  He feels like he has no energy during the day, and has a hard time staying focused.  He goes to sleep between 930 and 1130 pm.  He falls asleep after 15 minutes to an hour.  He wakes up 1 or 2 times to use the bathroom.  He gets out of bed at 610 am.  He feels tired in the morning.  He denies morning headache.  He does not use anything to help him fall sleep or stay awake.  He denies sleep walking, sleep talking, bruxism, or nightmares.  There is no history of restless legs.  He denies sleep hallucinations, sleep paralysis, or cataplexy.  The Epworth score is 9 out of 24.   Comprehensive Respiratory Exam:  Appearance - well kempt  ENMT - nasal mucosa moist, turbinates clear, midline nasal septum, no dental lesions, no gingival bleeding, no oral exudates, no tonsillar hypertrophy, MP 3, enlarged tongue Neck - no masses, trachea midline, no thyromegaly, no elevation in JVP Respiratory - normal appearance of chest wall, normal respiratory effort w/o accessory  muscle use, no dullness on percussion, no wheezing or rales CV - s1s2 regular rate and rhythm, no murmurs, no peripheral edema, no varicosities, radial pulses symmetric GI - soft, non tender, no masses, no hepatosplenomegaly Lymph - no adenopathy noted in neck and axillary areas MSK - normal muscle strength and tone, normal gait Ext - 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  His BMI is > 35.  He has hypertension, and anxiety.  Previous sleep study showed moderate sleep apnea.  Assessment/Plan:   Snoring with excessive daytime sleepiness and history of obstructive sleep apnea. - will need to arrange for a home sleep study  Obesity. - discussed how weight can impact sleep and risk for sleep disordered breathing - discussed options to assist with weight loss: combination of diet modification, cardiovascular and strength training exercises  Cardiovascular risk. - had an extensive discussion regarding the adverse health consequences related to untreated sleep disordered breathing - specifically discussed the risks for hypertension, coronary artery disease, cardiac dysrhythmias, cerebrovascular disease, and diabetes - lifestyle modification discussed  Safe driving practices. - discussed how sleep disruption can increase risk of accidents, particularly when driving - safe driving practices were discussed  Therapies for obstructive sleep apnea. - if the sleep study shows significant sleep apnea, then various therapies for treatment were reviewed: CPAP, oral appliance, and surgical interventions    Patient Instructions  Will arrange for home sleep study Will call to arrange for follow up after sleep study reviewed     Chesley Mires,  MD [place] 05/17/2018, 12:03 PM  Flow Sheet  Sleep tests: HST 04/17/13 >> AHI 21, SaO2 low 80%  Review of Systems: Constitutional: Negative for fever and unexpected weight change.  HENT: Positive for postnasal drip, sinus pressure and sinus pain. Negative for congestion, dental problem, ear pain, nosebleeds, rhinorrhea, sneezing, sore throat and trouble swallowing.   Eyes: Negative for redness and itching.  Respiratory: Positive for cough, choking and shortness of breath. Negative for chest tightness and wheezing.   Cardiovascular: Positive for leg swelling. Negative for palpitations.  Gastrointestinal: Negative for nausea and vomiting.  Genitourinary: Negative for dysuria.  Musculoskeletal: Negative for joint swelling.  Skin: Negative for rash.  Allergic/Immunologic: Positive for environmental allergies. Negative for food allergies and immunocompromised state.  Neurological: Negative for headaches.  Hematological: Does not bruise/bleed easily.  Psychiatric/Behavioral: Negative for dysphoric mood. The patient is nervous/anxious.    Past Medical History: He  has a past medical history of H/O seasonal allergies and Hypertension.  Past Surgical History: He  has a past surgical history that includes Fracture surgery (1965).  Family History: His family history includes AAA (abdominal aortic aneurysm) in his father; Arthritis in his maternal grandfather, maternal grandmother, paternal grandfather, and paternal grandmother; COPD in his father; Cancer in his father; Colon cancer in his paternal grandfather; Heart disease in his cousin, father, and maternal grandfather; Hypertension in his father and mother; Mental illness (age of onset: 83) in his son; Sleep apnea in his mother; Stroke in his mother.  Social History: He  reports that he has never smoked. He has never used smokeless tobacco. He reports that he does not drink alcohol or use  drugs.  Medications: Allergies as of 05/17/2018      Reactions   Enalapril Maleate Cough   Codeine Nausea And Vomiting   Hydrochlorothiazide Nausea Only      Medication List        Accurate as of 05/17/18 12:03 PM. Always use your most recent med list.          amLODipine 10 MG tablet Commonly known as:  NORVASC Take 1 tablet (10 mg total) by mouth daily.   aspirin EC 81 MG tablet Take 81 mg by mouth as needed.   cetirizine 10 MG tablet Commonly known as:  ZYRTEC Take 10 mg by mouth daily.   GOODY HEADACHE PO Take 1 packet by mouth once as needed. For pain or headache   losartan 100 MG tablet Commonly known as:  COZAAR TAKE 1 TABLET BY MOUTH ONCE DAILY   naproxen sodium 220 MG tablet Commonly known as:  ALEVE Take 220 mg by mouth 2 (two) times daily as needed.   NASACORT ALLERGY 24HR NA Place into the nose.   sertraline 25 MG tablet Commonly known as:  ZOLOFT TAKE 1 TABLET BY MOUTH ONCE DAILY

## 2018-06-02 DIAGNOSIS — G4733 Obstructive sleep apnea (adult) (pediatric): Secondary | ICD-10-CM | POA: Diagnosis not present

## 2018-06-03 ENCOUNTER — Other Ambulatory Visit: Payer: Self-pay | Admitting: *Deleted

## 2018-06-03 ENCOUNTER — Telehealth: Payer: Self-pay | Admitting: Pulmonary Disease

## 2018-06-03 DIAGNOSIS — G4733 Obstructive sleep apnea (adult) (pediatric): Secondary | ICD-10-CM | POA: Diagnosis not present

## 2018-06-03 NOTE — Telephone Encounter (Signed)
Called and spoke with patient regarding results.  Informed the patient of results and recommendations today. Pt is agreeable to cpap, placed order today for auto cpap 5-15cm H20 with heated humidity, supplies and mask. Pt agreed to call for 35mo f/u with VS appt once set up on cpap in home Pt verbalized understanding and denied any questions or concerns at this time.  Nothing further needed.

## 2018-06-03 NOTE — Telephone Encounter (Signed)
HST 06/02/18 >> AHI 9.2, SaO2 low 82%   Will have my nurse inform pt that sleep study shows mild sleep apnea.  Options are 1) CPAP now, 2) ROV first.  If He is agreeable to CPAP, then please send order for auto CPAP range 5 to 15 cm H2O with heated humidity and mask of choice.  Have download sent 1 month after starting CPAP and set up ROV 2 months after starting CPAP.  ROV can be with me or NP.

## 2018-06-15 ENCOUNTER — Telehealth: Payer: Self-pay | Admitting: Family Medicine

## 2018-06-15 NOTE — Telephone Encounter (Signed)
LM to [facility], per the AVS on 02/07/2018, Dr. Birdie Riddle wanted him to follow in 4-6 weeks. Patient should make an appt for follow-up.   CRM Created.

## 2018-06-15 NOTE — Telephone Encounter (Signed)
Copied from [place] [phone]. Topic: Quick Communication - See Telephone Encounter >> Jun 15, 2018  9:41 AM Rutherford Nail, NT wrote: CRM for notification. See Telephone encounter for: 06/15/18. Patient calling and would like to know if Dr Birdie Riddle is needing him to follow up before his CPE in 11/2018? States that she usually has him follow up for his medication and his blood pressure. CB#: [phone]

## 2018-06-15 NOTE — Telephone Encounter (Signed)
Patient scheduled for appointment on 06/23/18 with PCP

## 2018-06-23 ENCOUNTER — Other Ambulatory Visit (INDEPENDENT_AMBULATORY_CARE_PROVIDER_SITE_OTHER): Payer: BLUE CROSS/BLUE SHIELD

## 2018-06-23 ENCOUNTER — Encounter: Payer: Self-pay | Admitting: Family Medicine

## 2018-06-23 ENCOUNTER — Other Ambulatory Visit: Payer: Self-pay

## 2018-06-23 ENCOUNTER — Ambulatory Visit: Payer: BLUE CROSS/BLUE SHIELD | Admitting: Family Medicine

## 2018-06-23 VITALS — BP 132/78 | HR 72 | Temp 98.2°F | Resp 16 | Ht 63.0 in | Wt 269.1 lb

## 2018-06-23 DIAGNOSIS — F329 Major depressive disorder, single episode, unspecified: Secondary | ICD-10-CM | POA: Diagnosis not present

## 2018-06-23 DIAGNOSIS — R7309 Other abnormal glucose: Secondary | ICD-10-CM

## 2018-06-23 DIAGNOSIS — F32A Depression, unspecified: Secondary | ICD-10-CM

## 2018-06-23 DIAGNOSIS — I10 Essential (primary) hypertension: Secondary | ICD-10-CM | POA: Diagnosis not present

## 2018-06-23 DIAGNOSIS — F419 Anxiety disorder, unspecified: Secondary | ICD-10-CM | POA: Diagnosis not present

## 2018-06-23 LAB — HEPATIC FUNCTION PANEL
ALK PHOS: 73 U/L (ref 39–117)
ALT: 52 U/L (ref 0–53)
AST: 27 U/L (ref 0–37)
Albumin: 4.3 g/dL (ref 3.5–5.2)
BILIRUBIN DIRECT: 0.1 mg/dL (ref 0.0–0.3)
Total Bilirubin: 0.7 mg/dL (ref 0.2–1.2)
Total Protein: 7 g/dL (ref 6.0–8.3)

## 2018-06-23 LAB — LIPID PANEL
CHOL/HDL RATIO: 4
Cholesterol: 155 mg/dL (ref 0–200)
HDL: 38.6 mg/dL — AB (ref >39.00)
LDL CALC: 96 mg/dL (ref 0–99)
NonHDL: 116.89
TRIGLYCERIDES: 102 mg/dL (ref 0.0–149.0)
VLDL: 20.4 mg/dL (ref 0.0–40.0)

## 2018-06-23 LAB — BASIC METABOLIC PANEL
BUN: 13 mg/dL (ref 6–23)
CALCIUM: 9.4 mg/dL (ref 8.4–10.5)
CO2: 29 meq/L (ref 19–32)
Chloride: 106 mEq/L (ref 96–112)
Creatinine, Ser: 0.92 mg/dL (ref 0.40–1.50)
GFR: 89.3 mL/min (ref >60.00)
GLUCOSE: 120 mg/dL — AB (ref 70–99)
POTASSIUM: 4 meq/L (ref 3.5–5.1)
SODIUM: 140 meq/L (ref 135–145)

## 2018-06-23 LAB — CBC WITH DIFFERENTIAL/PLATELET
BASOS ABS: 0 10*3/uL (ref 0.0–0.1)
Basophils Relative: 0.8 % (ref 0.0–3.0)
EOS ABS: 0.1 10*3/uL (ref 0.0–0.7)
Eosinophils Relative: 1.3 % (ref 0.0–5.0)
HCT: 47.2 % (ref 39.0–52.0)
Hemoglobin: 15.9 g/dL (ref 13.0–17.0)
LYMPHS ABS: 1.1 10*3/uL (ref 0.7–4.0)
Lymphocytes Relative: 24.2 % (ref 12.0–46.0)
MCHC: 33.7 g/dL (ref 30.0–36.0)
MCV: 91 fl (ref 78.0–100.0)
MONO ABS: 0.3 10*3/uL (ref 0.1–1.0)
Monocytes Relative: 6.9 % (ref 3.0–12.0)
NEUTROS ABS: 3.1 10*3/uL (ref 1.4–7.7)
NEUTROS PCT: 66.8 % (ref 43.0–77.0)
PLATELETS: 208 10*3/uL (ref 150.0–400.0)
RBC: 5.19 Mil/uL (ref 4.22–5.81)
RDW: 13.8 % (ref 11.5–15.5)
WBC: 4.6 10*3/uL (ref 4.0–10.5)

## 2018-06-23 LAB — TSH: TSH: 2.34 u[IU]/mL (ref 0.35–4.50)

## 2018-06-23 LAB — HEMOGLOBIN A1C: HEMOGLOBIN A1C: 6.1 % (ref 4.6–6.5)

## 2018-06-23 MED ORDER — FUROSEMIDE 20 MG PO TABS: 20.0000 mg | ORAL_TABLET | Freq: Every day | ORAL | 3 refills | Status: DC

## 2018-06-23 NOTE — Assessment & Plan Note (Signed)
Deteriorated.  Pt continues to gain weight.  This can be contributing to his fatigue.  Check labs to risk stratify.  Stressed need for healthy diet and regular exercise.  Will follow.

## 2018-06-23 NOTE — Assessment & Plan Note (Signed)
Pt feels like he is doing well since the addition of Sertraline.  He is not interested in increasing dose at this time.  Will follow.

## 2018-06-23 NOTE — Assessment & Plan Note (Signed)
Chronic problem.  BP was initially elevated but this improved by end of visit.  I suspect that his CPAP will improve this as well.  Given his fluid retention, will add low dose Lasix as needed for swelling.  Reviewed lifestyle and dietary modifications.  Will follow.

## 2018-06-23 NOTE — Patient Instructions (Signed)
Follow up in 4-6 weeks to recheck BP and fluid retention We'll notify you of your lab results and make any changes if needed Continue to work on healthy diet and regular exercise- you can do it! I think the CPAP will help with your energy level and your blood pressure ADD the Furosemide once daily to help w/ fluid retention Limit your salt/sodium intake Drink plenty of water Call with any questions or concerns Hang in there!!

## 2018-06-23 NOTE — Progress Notes (Signed)
   Subjective:    Patient ID: Jacob Crawford, male    DOB: 29-Aug-1956, 62 y.o.   MRN: [id number]  HPI HTN- ongoing issue for pt.  On Amlodipine 10mg  daily and Losartan 100mg  daily.  Pt has a 'high stress job' and is 'super tense'.  Pt reports that when he checks it at home, 'the upper number runs a little high but the bottom number is ok'.  Pt reports that he will have mid-day fatigue and feeling somewhat shaky.  Denies light headedness.  No CP, SOB.  Possibly some LE edema.  Has CPAP coming next week.  Anxiety/Depression- chronic problem, on Sertraline 25mg  daily.  Denies current sxs of depression.  Feels medication has 'put me on a more even keel'.  Feels anxiety has improved.  Obesity- deteriorated.  Pt has gained 6 lbs since last visit.  No regular exercise.  Review of Systems For ROS see HPI     Objective:   Physical Exam  Constitutional: He is oriented to person, place, and time. He appears well-developed and well-nourished. No distress.  obese  HENT:  Head: Normocephalic and atraumatic.  Eyes: Pupils are equal, round, and reactive to light. Conjunctivae and EOM are normal.  Neck: Normal range of motion. Neck supple. No thyromegaly present.  Cardiovascular: Normal rate, regular rhythm, normal heart sounds and intact distal pulses.  No murmur heard. Pulmonary/Chest: Effort normal and breath sounds normal. No respiratory distress.  Abdominal: Soft. Bowel sounds are normal. He exhibits no distension.  Musculoskeletal: He exhibits edema (trace-1+ edema of lower extremities bilaterally).  Lymphadenopathy:    He has no cervical adenopathy.  Neurological: He is alert and oriented to person, place, and time. No cranial nerve deficit.  Skin: Skin is warm and dry.  Psychiatric: He has a normal mood and affect. His behavior is normal.  Vitals reviewed.         Assessment & Plan:

## 2018-06-29 DIAGNOSIS — G4733 Obstructive sleep apnea (adult) (pediatric): Secondary | ICD-10-CM | POA: Diagnosis not present

## 2018-07-30 DIAGNOSIS — G4733 Obstructive sleep apnea (adult) (pediatric): Secondary | ICD-10-CM | POA: Diagnosis not present

## 2018-08-08 ENCOUNTER — Encounter: Payer: Self-pay | Admitting: Family Medicine

## 2018-08-08 ENCOUNTER — Other Ambulatory Visit: Payer: Self-pay

## 2018-08-08 ENCOUNTER — Ambulatory Visit: Payer: BLUE CROSS/BLUE SHIELD | Admitting: Family Medicine

## 2018-08-08 ENCOUNTER — Encounter: Payer: Self-pay | Admitting: General Practice

## 2018-08-08 VITALS — BP 122/82 | HR 72 | Temp 98.0°F | Resp 16 | Ht 63.0 in | Wt 271.4 lb

## 2018-08-08 DIAGNOSIS — Z23 Encounter for immunization: Secondary | ICD-10-CM | POA: Diagnosis not present

## 2018-08-08 DIAGNOSIS — R609 Edema, unspecified: Secondary | ICD-10-CM | POA: Diagnosis not present

## 2018-08-08 DIAGNOSIS — I10 Essential (primary) hypertension: Secondary | ICD-10-CM | POA: Diagnosis not present

## 2018-08-08 LAB — BASIC METABOLIC PANEL
BUN: 17 mg/dL (ref 6–23)
CO2: 29 meq/L (ref 19–32)
Calcium: 8.9 mg/dL (ref 8.4–10.5)
Chloride: 106 mEq/L (ref 96–112)
Creatinine, Ser: 0.82 mg/dL (ref 0.40–1.50)
GFR: 101.94 mL/min (ref >60.00)
GLUCOSE: 110 mg/dL — AB (ref 70–99)
POTASSIUM: 4 meq/L (ref 3.5–5.1)
Sodium: 142 mEq/L (ref 135–145)

## 2018-08-08 NOTE — Patient Instructions (Addendum)
Schedule your complete physical for anytime after Jan 3 We'll notify you of your lab results and make any changes if needed Continue to work on healthy diet and regular exercise- you can do it! Increase your water intake to avoid muscle tightness Call with any questions or concerns Happy Fall!!

## 2018-08-08 NOTE — Progress Notes (Signed)
   Subjective:    Patient ID: Jacob Crawford, male    DOB: Apr 11, 1956, 62 y.o.   MRN: [id number]  HPI HTN- chronic problem, on Amlodipine 10mg  daily, Losartan 100mg  daily, Lasix 20mg  daily.  Denies CP, SOB, HAs, visual changes.  Fluid retention- pt was started on Lasix at last visit.  Pt reports swelling has improved since adding the Lasix.  No longer feeling the tight feeling in the calves.  Having some tightness in thighs but admits to poor water intake.   Review of Systems For ROS see HPI     Objective:   Physical Exam  Constitutional: He is oriented to person, place, and time. He appears well-developed and well-nourished. No distress.  obese  HENT:  Head: Normocephalic and atraumatic.  Eyes: Pupils are equal, round, and reactive to light. Conjunctivae and EOM are normal.  Neck: Normal range of motion. Neck supple. No thyromegaly present.  Cardiovascular: Normal rate, regular rhythm, normal heart sounds and intact distal pulses.  No murmur heard. Pulmonary/Chest: Effort normal and breath sounds normal. No respiratory distress.  Abdominal: Soft. Bowel sounds are normal. He exhibits no distension.  Musculoskeletal: He exhibits no edema.  Lymphadenopathy:    He has no cervical adenopathy.  Neurological: He is alert and oriented to person, place, and time. No cranial nerve deficit.  Skin: Skin is warm and dry.  Psychiatric: He has a normal mood and affect. His behavior is normal.  Vitals reviewed.      Assessment & Plan:  Fluid Retention- improved since adding Lasix.  Pt reports tightness in thighs bilaterally.  Admits to poor water intake.  Will check BMP to assess K+.  Encouraged increased water and will replete K+ prn.  Pt expressed understanding and is in agreement w/ plan.

## 2018-08-08 NOTE — Assessment & Plan Note (Signed)
Chronic problem.  Well controlled today.  Asymptomatic.  Check BMP.  No anticipated med changes.  Will follow.

## 2018-08-24 ENCOUNTER — Other Ambulatory Visit: Payer: Self-pay | Admitting: Family Medicine

## 2018-08-29 DIAGNOSIS — G4733 Obstructive sleep apnea (adult) (pediatric): Secondary | ICD-10-CM | POA: Diagnosis not present

## 2018-09-29 DIAGNOSIS — G4733 Obstructive sleep apnea (adult) (pediatric): Secondary | ICD-10-CM | POA: Diagnosis not present

## 2018-10-10 ENCOUNTER — Other Ambulatory Visit: Payer: Self-pay

## 2018-10-10 ENCOUNTER — Ambulatory Visit: Payer: BLUE CROSS/BLUE SHIELD | Admitting: Family Medicine

## 2018-10-10 ENCOUNTER — Encounter: Payer: Self-pay | Admitting: Family Medicine

## 2018-10-10 VITALS — BP 123/82 | HR 89 | Temp 98.9°F | Resp 16 | Ht 63.0 in | Wt 270.2 lb

## 2018-10-10 DIAGNOSIS — J329 Chronic sinusitis, unspecified: Secondary | ICD-10-CM

## 2018-10-10 DIAGNOSIS — B9689 Other specified bacterial agents as the cause of diseases classified elsewhere: Secondary | ICD-10-CM | POA: Diagnosis not present

## 2018-10-10 MED ORDER — PROMETHAZINE-DM 6.25-15 MG/5ML PO SYRP: 5.0000 mL | ORAL_SOLUTION | Freq: Four times a day (QID) | ORAL | 0 refills | Status: DC | PRN

## 2018-10-10 MED ORDER — AMOXICILLIN 875 MG PO TABS: 875.0000 mg | ORAL_TABLET | Freq: Two times a day (BID) | ORAL | 0 refills | Status: DC

## 2018-10-10 NOTE — Progress Notes (Signed)
   Subjective:    Patient ID: Jacob Crawford, male    DOB: 1956-09-22, 62 y.o.   MRN: [id number]  HPI URI- pt reports he had 'blood in my sinuses' starting ~3 weeks ago.  + sick contacts.  Woke Friday feeling very poorly.  + sore throat, HA, cough- productive of yellow/green sputum (worse in the AM).  R ear pain>L.  + chills but no documented fevers.  + nausea.  No vomiting.  No tooth pain.   Review of Systems For ROS see HPI     Objective:   Physical Exam  Constitutional: He appears well-developed and well-nourished. No distress.  HENT:  Head: Normocephalic and atraumatic.  Right Ear: Tympanic membrane normal.  Left Ear: Tympanic membrane normal.  Nose: Mucosal edema and rhinorrhea present. Right sinus exhibits maxillary sinus tenderness and frontal sinus tenderness. Left sinus exhibits maxillary sinus tenderness and frontal sinus tenderness.  Mouth/Throat: Mucous membranes are normal. Oropharyngeal exudate and posterior oropharyngeal erythema present. No posterior oropharyngeal edema.  + PND  Eyes: Pupils are equal, round, and reactive to light. Conjunctivae and EOM are normal.  Neck: Normal range of motion. Neck supple.  Cardiovascular: Normal rate, regular rhythm and normal heart sounds.  Pulmonary/Chest: Effort normal and breath sounds normal. No respiratory distress. He has no wheezes.  + hacking cough  Lymphadenopathy:    He has no cervical adenopathy.  Skin: Skin is warm and dry.  Vitals reviewed.         Assessment & Plan:  Bacterial sinusitis- new.  Pt's sxs and PE consistent w/ infxn.  Start abx as he has been sick for >3 weeks.  Reviewed supportive care and red flags that should prompt return.  Pt expressed understanding and is in agreement w/ plan.

## 2018-10-10 NOTE — Patient Instructions (Signed)
Follow up as needed or as scheduled START the Amoxicillin twice daily- take w/ food Drink plenty of fluids REST! Continue your daily allergy medication to improve your congestion USE the cough syrup as needed- may cause drowsiness Call with any questions or concerns Happy Holidays!!

## 2018-10-19 ENCOUNTER — Other Ambulatory Visit: Payer: Self-pay | Admitting: Family Medicine

## 2018-10-29 DIAGNOSIS — G4733 Obstructive sleep apnea (adult) (pediatric): Secondary | ICD-10-CM | POA: Diagnosis not present

## 2018-11-15 ENCOUNTER — Telehealth: Payer: Self-pay

## 2018-11-15 NOTE — Telephone Encounter (Signed)
Called and rescheduled for 12/06/18.

## 2018-11-15 NOTE — Telephone Encounter (Signed)
Copied from [place] [id number]. Topic: General - Other >> Nov 15, 2018 11:00 AM Carolyn Stare wrote:  Pt cancel his CPE for 11/18/2018 would like to get rescheduled soon. Dr Birdie Riddle CPE schedule is out till April so he ask that her nurse give him a call back

## 2018-11-18 ENCOUNTER — Encounter: Payer: BLUE CROSS/BLUE SHIELD | Admitting: Family Medicine

## 2018-11-22 ENCOUNTER — Encounter: Payer: Self-pay | Admitting: Family Medicine

## 2018-11-22 MED ORDER — AMOXICILLIN-POT CLAVULANATE 875-125 MG PO TABS: 1.0000 | ORAL_TABLET | Freq: Two times a day (BID) | ORAL | 0 refills | Status: DC

## 2018-11-22 NOTE — Addendum Note (Signed)
Addended by: Brunetta Jeans on: 11/22/2018 04:34 PM   Modules accepted: Orders

## 2018-11-29 ENCOUNTER — Other Ambulatory Visit: Payer: Self-pay | Admitting: Family Medicine

## 2018-11-29 ENCOUNTER — Encounter: Payer: Self-pay | Admitting: Family Medicine

## 2018-11-29 DIAGNOSIS — G4733 Obstructive sleep apnea (adult) (pediatric): Secondary | ICD-10-CM | POA: Diagnosis not present

## 2018-11-29 MED ORDER — FUROSEMIDE 20 MG PO TABS: 20.0000 mg | ORAL_TABLET | Freq: Every day | ORAL | 3 refills | Status: DC

## 2018-11-29 MED ORDER — LOSARTAN POTASSIUM 100 MG PO TABS: 100.0000 mg | ORAL_TABLET | Freq: Every day | ORAL | 1 refills | Status: DC

## 2018-12-06 ENCOUNTER — Encounter: Payer: Self-pay | Admitting: Family Medicine

## 2018-12-06 ENCOUNTER — Other Ambulatory Visit: Payer: Self-pay

## 2018-12-06 ENCOUNTER — Ambulatory Visit (INDEPENDENT_AMBULATORY_CARE_PROVIDER_SITE_OTHER): Payer: BLUE CROSS/BLUE SHIELD | Admitting: Family Medicine

## 2018-12-06 VITALS — BP 126/86 | HR 86 | Temp 98.0°F | Resp 16 | Ht 63.0 in | Wt 268.5 lb

## 2018-12-06 DIAGNOSIS — Z0000 Encounter for general adult medical examination without abnormal findings: Secondary | ICD-10-CM | POA: Diagnosis not present

## 2018-12-06 DIAGNOSIS — I10 Essential (primary) hypertension: Secondary | ICD-10-CM

## 2018-12-06 DIAGNOSIS — Z125 Encounter for screening for malignant neoplasm of prostate: Secondary | ICD-10-CM | POA: Diagnosis not present

## 2018-12-06 LAB — BASIC METABOLIC PANEL
BUN: 19 mg/dL (ref 6–23)
CO2: 30 meq/L (ref 19–32)
Calcium: 9.5 mg/dL (ref 8.4–10.5)
Chloride: 103 mEq/L (ref 96–112)
Creatinine, Ser: 0.94 mg/dL (ref 0.40–1.50)
GFR: 81.84 mL/min (ref >60.00)
Glucose, Bld: 119 mg/dL — ABNORMAL HIGH (ref 70–99)
Potassium: 4.3 mEq/L (ref 3.5–5.1)
Sodium: 141 mEq/L (ref 135–145)

## 2018-12-06 LAB — LIPID PANEL
CHOLESTEROL: 148 mg/dL (ref 0–200)
HDL: 36.2 mg/dL — AB (ref >39.00)
LDL CALC: 95 mg/dL (ref 0–99)
NonHDL: 111.58
TRIGLYCERIDES: 84 mg/dL (ref 0.0–149.0)
Total CHOL/HDL Ratio: 4
VLDL: 16.8 mg/dL (ref 0.0–40.0)

## 2018-12-06 LAB — CBC WITH DIFFERENTIAL/PLATELET
BASOS ABS: 0 10*3/uL (ref 0.0–0.1)
Basophils Relative: 0.8 % (ref 0.0–3.0)
EOS ABS: 0.1 10*3/uL (ref 0.0–0.7)
Eosinophils Relative: 1.2 % (ref 0.0–5.0)
HCT: 48.8 % (ref 39.0–52.0)
Hemoglobin: 16.5 g/dL (ref 13.0–17.0)
Lymphocytes Relative: 23.2 % (ref 12.0–46.0)
Lymphs Abs: 1.3 10*3/uL (ref 0.7–4.0)
MCHC: 33.8 g/dL (ref 30.0–36.0)
MCV: 92.2 fl (ref 78.0–100.0)
Monocytes Absolute: 0.4 10*3/uL (ref 0.1–1.0)
Monocytes Relative: 7.2 % (ref 3.0–12.0)
Neutro Abs: 3.8 10*3/uL (ref 1.4–7.7)
Neutrophils Relative %: 67.6 % (ref 43.0–77.0)
Platelets: 222 10*3/uL (ref 150.0–400.0)
RBC: 5.29 Mil/uL (ref 4.22–5.81)
RDW: 13.6 % (ref 11.5–15.5)
WBC: 5.7 10*3/uL (ref 4.0–10.5)

## 2018-12-06 LAB — HEPATIC FUNCTION PANEL
ALBUMIN: 4.4 g/dL (ref 3.5–5.2)
ALT: 43 U/L (ref 0–53)
AST: 21 U/L (ref 0–37)
Alkaline Phosphatase: 90 U/L (ref 39–117)
Bilirubin, Direct: 0.1 mg/dL (ref 0.0–0.3)
Total Bilirubin: 0.6 mg/dL (ref 0.2–1.2)
Total Protein: 7.1 g/dL (ref 6.0–8.3)

## 2018-12-06 LAB — PSA: PSA: 2.19 ng/mL (ref 0.10–4.00)

## 2018-12-06 LAB — TSH: TSH: 2.59 u[IU]/mL (ref 0.35–4.50)

## 2018-12-06 NOTE — Progress Notes (Signed)
   Subjective:    Patient ID: Jacob Crawford, male    DOB: 04-23-1956, 63 y.o.   MRN: [id number]  HPI CPE- pt is unable to afford colonoscopy at this time.  UTD on immunizations.  Pt would like cardiology referral given HTN and obesity.  Pt plans to work out w/ son for accountability.   Review of Systems Patient reports no vision/hearing changes, anorexia, fever ,adenopathy, persistant/recurrent hoarseness, swallowing issues, chest pain, palpitations, edema, persistant/recurrent cough, hemoptysis, dyspnea (rest,exertional, paroxysmal nocturnal), gastrointestinal  bleeding (melena, rectal bleeding), abdominal pain, excessive heart burn, GU symptoms (dysuria, hematuria, voiding/incontinence issues) syncope, focal weakness, memory loss, numbness & tingling, skin/hair/nail changes, depression, anxiety, abnormal bruising/bleeding, musculoskeletal symptoms/signs.     Objective:   Physical Exam General Appearance:    Alert, cooperative, no distress, appears stated age  Head:    Normocephalic, without obvious abnormality, atraumatic  Eyes:    PERRL, conjunctiva/corneas clear, EOM's intact, fundi    benign, both eyes       Ears:    Normal TM's and external ear canals, both ears  Nose:   Nares normal, septum midline, mucosa normal, no drainage   or sinus tenderness  Throat:   Lips, mucosa, and tongue normal; teeth and gums normal  Neck:   Supple, symmetrical, trachea midline, no adenopathy;       thyroid:  No enlargement/tenderness/nodules  Back:     Symmetric, no curvature, ROM normal, no CVA tenderness  Lungs:     Clear to auscultation bilaterally, respirations unlabored  Chest wall:    No tenderness or deformity  Heart:    Regular rate and rhythm, S1 and S2 normal, no murmur, rub   or gallop  Abdomen:     Soft, non-tender, bowel sounds active all four quadrants,    no masses, no organomegaly  Genitalia:    Deferred at pt's request  Rectal:    Extremities:   Extremities normal, atraumatic,  no cyanosis or edema  Pulses:   2+ and symmetric all extremities  Skin:   Skin color, texture, turgor normal, no rashes or lesions  Lymph nodes:   Cervical, supraclavicular, and axillary nodes normal  Neurologic:   CNII-XII intact. Normal strength, sensation and reflexes      throughout          Assessment & Plan:

## 2018-12-06 NOTE — Patient Instructions (Signed)
Follow up in 6 months to recheck BP We'll notify you of your lab results and make any changes if needed Continue to work on healthy diet and regular exercise- you can do it! We'll call you with your cardiology appt Call with any questions or concerns Hang In There!!!

## 2018-12-07 ENCOUNTER — Other Ambulatory Visit (INDEPENDENT_AMBULATORY_CARE_PROVIDER_SITE_OTHER): Payer: BLUE CROSS/BLUE SHIELD

## 2018-12-07 DIAGNOSIS — R7309 Other abnormal glucose: Secondary | ICD-10-CM

## 2018-12-07 LAB — HEMOGLOBIN A1C: Hgb A1c MFr Bld: 6.2 % (ref 4.6–6.5)

## 2019-01-03 ENCOUNTER — Encounter: Payer: Self-pay | Admitting: Cardiovascular Disease

## 2019-01-03 ENCOUNTER — Other Ambulatory Visit: Payer: Self-pay | Admitting: Family Medicine

## 2019-01-03 ENCOUNTER — Ambulatory Visit: Payer: BLUE CROSS/BLUE SHIELD | Admitting: Cardiovascular Disease

## 2019-01-04 DIAGNOSIS — G4733 Obstructive sleep apnea (adult) (pediatric): Secondary | ICD-10-CM | POA: Diagnosis not present

## 2019-02-02 DIAGNOSIS — G4733 Obstructive sleep apnea (adult) (pediatric): Secondary | ICD-10-CM | POA: Diagnosis not present

## 2019-02-21 ENCOUNTER — Other Ambulatory Visit: Payer: Self-pay | Admitting: Family Medicine

## 2019-03-05 DIAGNOSIS — G4733 Obstructive sleep apnea (adult) (pediatric): Secondary | ICD-10-CM | POA: Diagnosis not present

## 2019-04-04 DIAGNOSIS — G4733 Obstructive sleep apnea (adult) (pediatric): Secondary | ICD-10-CM | POA: Diagnosis not present

## 2019-04-24 ENCOUNTER — Other Ambulatory Visit: Payer: Self-pay | Admitting: Family Medicine

## 2019-05-05 DIAGNOSIS — G4733 Obstructive sleep apnea (adult) (pediatric): Secondary | ICD-10-CM | POA: Diagnosis not present

## 2019-05-24 ENCOUNTER — Other Ambulatory Visit: Payer: Self-pay | Admitting: Family Medicine

## 2019-06-04 DIAGNOSIS — G4733 Obstructive sleep apnea (adult) (pediatric): Secondary | ICD-10-CM | POA: Diagnosis not present

## 2019-06-05 ENCOUNTER — Ambulatory Visit (INDEPENDENT_AMBULATORY_CARE_PROVIDER_SITE_OTHER): Payer: Self-pay | Admitting: Family Medicine

## 2019-06-05 ENCOUNTER — Other Ambulatory Visit: Payer: Self-pay

## 2019-06-05 ENCOUNTER — Encounter: Payer: Self-pay | Admitting: Family Medicine

## 2019-06-05 DIAGNOSIS — I10 Essential (primary) hypertension: Secondary | ICD-10-CM

## 2019-06-05 NOTE — Progress Notes (Signed)
   Virtual Visit via Video   I connected with patient on 06/05/19 at  9:20 AM EDT by a video enabled telemedicine application and verified that I am speaking with the correct person using two identifiers.  Location patient: Home Location provider: Acupuncturist, Office Persons participating in the virtual visit: Patient, Provider, [place] (Jess B)  I discussed the limitations of evaluation and management by telemedicine and the availability of in person appointments. The patient expressed understanding and agreed to proceed.  Subjective:   HPI:   HTN- chronic problem, on Amlodipine 10mg  daily, Lasix 20mg  daily, Losartan 100mg  daily.  Pt unable to check BP today but son is RN and he will call back w/ vitals later this week.  No CP, SOB, HAs, visual changes, edema.  Obesity- ongoing issue for pt.  'I still haven't lost any weight'.  No abd pain, N/V.  ROS:   See pertinent positives and negatives per HPI.  Patient Active Problem List   Diagnosis Date Noted  . Anxiety and depression 02/07/2018  . Allergic rhinitis 03/01/2017  . Skin lesion of face 09/05/2015  . Upper airway cough syndrome 06/04/2015  . Solitary pulmonary nodule 06/04/2015  . Hematuria 05/20/2015  . Abdominal pain, epigastric 11/01/2013  . OSA (obstructive sleep apnea) 03/30/2013  . Routine general medical examination at a health care facility 12/27/2012  . Essential hypertension 12/20/2012  . Lipoma of back 12/20/2012  . Meralgia paresthetica of left side 12/20/2012  . Morbid obesity ([place]) 12/20/2012    Social History   Tobacco Use  . Smoking status: Never Smoker  . Smokeless tobacco: Never Used  Substance Use Topics  . Alcohol use: No    Current Outpatient Medications:  .  amLODipine (NORVASC) 10 MG tablet, Take 1 tablet by mouth once daily, Disp: 90 tablet, Rfl: 0 .  aspirin EC 81 MG tablet, Take 81 mg by mouth as needed., Disp: , Rfl:  .  Aspirin-Acetaminophen-Caffeine (GOODY HEADACHE PO), Take 1  packet by mouth once as needed. For pain or headache  , Disp: , Rfl:  .  cetirizine (ZYRTEC) 10 MG tablet, Take 10 mg by mouth daily., Disp: , Rfl:  .  furosemide (LASIX) 20 MG tablet, Take 1 tablet by mouth once daily, Disp: 30 tablet, Rfl: 0 .  losartan (COZAAR) 100 MG tablet, Take 1 tablet by mouth once daily, Disp: 90 tablet, Rfl: 0 .  sertraline (ZOLOFT) 25 MG tablet, Take 1 tablet by mouth once daily, Disp: 90 tablet, Rfl: 0 .  Triamcinolone Acetonide (NASACORT ALLERGY 24HR NA), Place into the nose., Disp: , Rfl:   Allergies  Allergen Reactions  . Enalapril Maleate Cough  . Codeine Nausea And Vomiting  . Hydrochlorothiazide Nausea Only  . Sulfa Antibiotics Rash    Ace cough    Objective:   There were no vitals taken for this visit.  AAOx3, NAD NCAT, EOMI Obese No obvious CN deficits Coloring WNL Pt is able to speak clearly, coherently without shortness of breath or increased work of breathing.  Thought process is linear.  Mood is appropriate.   Assessment and Plan:   HTN- chronic problem.  Pt unable to check BP today but will have son do so later this week.  Check labs.  No anticipated med changes.  Will follow.  Obesity- ongoing issue for pt.  Stressed need for healthy diet and regular exercise.  Will continue to follow.   Annye Asa, MD 06/05/2019

## 2019-06-05 NOTE — Progress Notes (Signed)
I have discussed the procedure for the virtual visit with the patient who has given consent to proceed with assessment and treatment.   Pt unable to obtain vitals.   Shacoya Burkhammer L Tanis Burnley, CMA     

## 2019-06-09 ENCOUNTER — Ambulatory Visit (INDEPENDENT_AMBULATORY_CARE_PROVIDER_SITE_OTHER): Payer: BLUE CROSS/BLUE SHIELD

## 2019-06-09 ENCOUNTER — Other Ambulatory Visit: Payer: Self-pay

## 2019-06-09 DIAGNOSIS — I10 Essential (primary) hypertension: Secondary | ICD-10-CM | POA: Diagnosis not present

## 2019-06-09 LAB — BASIC METABOLIC PANEL
BUN: 14 mg/dL (ref 6–23)
CO2: 27 mEq/L (ref 19–32)
Calcium: 9.1 mg/dL (ref 8.4–10.5)
Chloride: 106 mEq/L (ref 96–112)
Creatinine, Ser: 0.81 mg/dL (ref 0.40–1.50)
GFR: 97.01 mL/min (ref >60.00)
Glucose, Bld: 137 mg/dL — ABNORMAL HIGH (ref 70–99)
Potassium: 4.2 mEq/L (ref 3.5–5.1)
Sodium: 141 mEq/L (ref 135–145)

## 2019-06-09 LAB — HEPATIC FUNCTION PANEL
ALT: 54 U/L — ABNORMAL HIGH (ref 0–53)
AST: 28 U/L (ref 0–37)
Albumin: 4.2 g/dL (ref 3.5–5.2)
Alkaline Phosphatase: 85 U/L (ref 39–117)
Bilirubin, Direct: 0.1 mg/dL (ref 0.0–0.3)
Total Bilirubin: 0.5 mg/dL (ref 0.2–1.2)
Total Protein: 7 g/dL (ref 6.0–8.3)

## 2019-06-09 LAB — CBC WITH DIFFERENTIAL/PLATELET
Basophils Absolute: 0 10*3/uL (ref 0.0–0.1)
Basophils Relative: 0.4 % (ref 0.0–3.0)
Eosinophils Absolute: 0.1 10*3/uL (ref 0.0–0.7)
Eosinophils Relative: 2 % (ref 0.0–5.0)
HCT: 47 % (ref 39.0–52.0)
Hemoglobin: 15.9 g/dL (ref 13.0–17.0)
Lymphocytes Relative: 22.2 % (ref 12.0–46.0)
Lymphs Abs: 1.3 10*3/uL (ref 0.7–4.0)
MCHC: 33.8 g/dL (ref 30.0–36.0)
MCV: 92.5 fl (ref 78.0–100.0)
Monocytes Absolute: 0.4 10*3/uL (ref 0.1–1.0)
Monocytes Relative: 7.5 % (ref 3.0–12.0)
Neutro Abs: 4 10*3/uL (ref 1.4–7.7)
Neutrophils Relative %: 67.9 % (ref 43.0–77.0)
Platelets: 210 10*3/uL (ref 150.0–400.0)
RBC: 5.08 Mil/uL (ref 4.22–5.81)
RDW: 14 % (ref 11.5–15.5)
WBC: 5.9 10*3/uL (ref 4.0–10.5)

## 2019-06-09 LAB — LIPID PANEL
Cholesterol: 143 mg/dL (ref 0–200)
HDL: 37.9 mg/dL — ABNORMAL LOW (ref >39.00)
LDL Cholesterol: 86 mg/dL (ref 0–99)
NonHDL: 105.44
Total CHOL/HDL Ratio: 4
Triglycerides: 95 mg/dL (ref 0.0–149.0)
VLDL: 19 mg/dL (ref 0.0–40.0)

## 2019-06-09 LAB — TSH: TSH: 2.28 u[IU]/mL (ref 0.35–4.50)

## 2019-06-12 ENCOUNTER — Other Ambulatory Visit: Payer: Self-pay | Admitting: General Practice

## 2019-06-12 ENCOUNTER — Other Ambulatory Visit (INDEPENDENT_AMBULATORY_CARE_PROVIDER_SITE_OTHER): Payer: BLUE CROSS/BLUE SHIELD

## 2019-06-12 ENCOUNTER — Encounter: Payer: Self-pay | Admitting: Family Medicine

## 2019-06-12 DIAGNOSIS — R7309 Other abnormal glucose: Secondary | ICD-10-CM | POA: Diagnosis not present

## 2019-06-12 LAB — HEMOGLOBIN A1C: Hgb A1c MFr Bld: 7 % — ABNORMAL HIGH (ref 4.6–6.5)

## 2019-06-12 MED ORDER — METFORMIN HCL 500 MG PO TABS: 500.0000 mg | ORAL_TABLET | Freq: Two times a day (BID) | ORAL | 1 refills | Status: DC

## 2019-06-13 ENCOUNTER — Encounter: Payer: Self-pay | Admitting: Family Medicine

## 2019-06-13 NOTE — Telephone Encounter (Signed)
Leaving this for your review.

## 2019-07-05 DIAGNOSIS — G4733 Obstructive sleep apnea (adult) (pediatric): Secondary | ICD-10-CM | POA: Diagnosis not present

## 2019-07-25 DIAGNOSIS — E119 Type 2 diabetes mellitus without complications: Secondary | ICD-10-CM | POA: Diagnosis not present

## 2019-07-26 ENCOUNTER — Other Ambulatory Visit: Payer: Self-pay | Admitting: Family Medicine

## 2019-08-06 NOTE — Assessment & Plan Note (Signed)
Ongoing issue for pt.  He admits that he needs to make changes and plans to work out w/ his son for accountability.  Given his obesity and HTN will refer to Cardiology for a baseline workup.  Stressed need for healthy diet.  Check labs to risk stratify.  Will follow.

## 2019-08-06 NOTE — Assessment & Plan Note (Signed)
Pt's PE WNL w/ exception of obesity.  Unable to afford colonoscopy at this time.  Will plan to do in the future.  UTD on immunizations.  Check labs.  Anticipatory guidance provided.

## 2019-08-06 NOTE — Assessment & Plan Note (Signed)
Chronic problem.  Adequate control.  Stressed need for healthy diet and regular exercise.  Check labs.  No anticipated med changes.  Will follow.

## 2019-09-01 ENCOUNTER — Other Ambulatory Visit: Payer: Self-pay | Admitting: Family Medicine

## 2019-09-13 ENCOUNTER — Encounter: Payer: Self-pay | Admitting: Family Medicine

## 2019-09-13 ENCOUNTER — Ambulatory Visit: Payer: Self-pay

## 2019-09-13 ENCOUNTER — Other Ambulatory Visit: Payer: Self-pay

## 2019-09-13 DIAGNOSIS — Z20822 Contact with and (suspected) exposure to covid-19: Secondary | ICD-10-CM

## 2019-09-13 NOTE — Telephone Encounter (Signed)
Returned call to patient who states that he has been exposed to COVID-19 via his daughters boyfriend. He states that his daughter lives with him  His symptoms started 10/31.  He has cough chest congestion, neck tightness, and fatigue. He has run low grade fever 99.4-99.6. He has Hx of diabetes newly diagnosed. He has HTN and sleep apnea. Care advice read to patient and includes good preventative practices. He verbalized understanding. Patient states he had his COVID-19 test done today at a [facility] health site. He will quarantine until test results are given to him.   Reason for Disposition . [1] COVID-19 infection suspected by caller or triager AND [2] mild symptoms (cough, fever, or others) AND 99991111 no complications or SOB  Answer Assessment - Initial Assessment Questions 1. CLOSE CONTACT: "Who is the person with the confirmed or suspected COVID-19 infection that you were exposed to?"     Daughters boyfriend 2. PLACE of CONTACT: "Where were you when you were exposed to COVID-19?" (e.g., home, school, medical waiting room; which city?)    Home 2weeks ago 3. TYPE of CONTACT: "How much contact was there?" (e.g., sitting next to, live in same house, work in same office, same building)     Same house with daughter 4. DURATION of CONTACT: "How long were you in contact with the COVID-19 patient?" (e.g., a few seconds, passed by person, a few minutes, live with the patient)     Living with 5. DATE of CONTACT: "When did you have contact with a COVID-19 patient?" (e.g., how many days ago)     10/25th possibly 6. TRAVEL: "Have you traveled out of the country recently?" If so, "When and where?"     * Also ask about out-of-state travel, since the CDC has identified some high-risk cities for community spread in the [place].     * Note: Travel becomes less relevant if there is widespread community transmission where the patient lives.    no 7. COMMUNITY SPREAD: "Are there lots of cases of COVID-19 (community spread)  where you live?" (See public health department website, if unsure)      [place] 8. SYMPTOMS: "Do you have any symptoms?" (e.g., fever, cough, breathing difficulty)    Cough dry, chest congestion, neck pressure, fever for 3 day 98.4-98.6 tired 9. PREGNANCY OR POSTPARTUM: "Is there any chance you are pregnant?" "When was your last menstrual period?" "Did you deliver in the last 2 weeks?"     N/A 10. HIGH RISK: "Do you have any heart or lung problems? Do you have a weak immune system?" (e.g., CHF, COPD, asthma, HIV positive, chemotherapy, renal failure, diabetes mellitus, sickle cell anemia)      Diabetes no meds.  Answer Assessment - Initial Assessment Questions 1. COVID-19 DIAGNOSIS: "Who made your Coronavirus (COVID-19) diagnosis?" "Was it confirmed by a positive lab test?" If not diagnosed by a HCP, ask "Are there lots of cases (community spread) where you live?" (See public health department website, if unsure)     [place] 2. ONSET: "When did the COVID-19 symptoms start?"      10/31 3. WORST SYMPTOM: "What is your worst symptom?" (e.g., cough, fever, shortness of breath, muscle aches)     *cough fever tired 4. COUGH: "Do you have a cough?" If so, ask: "How bad is the cough?"      dry 5. FEVER: "Do you have a fever?" If so, ask: "What is your temperature, how was it measured, and when did it start?"  99.4-99.6 6. RESPIRATORY STATUS: "Describe your breathing?" (e.g., shortness of breath, wheezing, unable to speak)      no 7. BETTER-SAME-WORSE: "Are you getting better, staying the same or getting worse compared to yesterday?"  If getting worse, ask, "In what way?"     Worse chest congestion 8. HIGH RISK DISEASE: "Do you have any chronic medical problems?" (e.g., asthma, heart or lung disease, weak immune system, etc.)    Diabetes, HTN, Sleep apnea 9. PREGNANCY: "Is there any chance you are pregnant?" "When was your last menstrual period?"    N/A 10. OTHER SYMPTOMS: "Do you have  any other symptoms?"  (e.g., chills, fatigue, headache, loss of smell or taste, muscle pain, sore throat)      Fatigue, minor HA,  Protocols used: CORONAVIRUS (COVID-19) DIAGNOSED OR SUSPECTED-A-AH, CORONAVIRUS (COVID-19) EXPOSURE-A-AH

## 2019-09-14 LAB — NOVEL CORONAVIRUS, NAA: SARS-CoV-2, NAA: DETECTED — AB

## 2019-09-14 NOTE — Telephone Encounter (Signed)
Please advise and pt has an appt on 11/20 with Tabori

## 2019-09-14 NOTE — Telephone Encounter (Signed)
FYI

## 2019-09-26 ENCOUNTER — Ambulatory Visit: Payer: BLUE CROSS/BLUE SHIELD | Admitting: Family Medicine

## 2019-09-29 ENCOUNTER — Other Ambulatory Visit: Payer: Self-pay

## 2019-09-29 ENCOUNTER — Ambulatory Visit (INDEPENDENT_AMBULATORY_CARE_PROVIDER_SITE_OTHER): Payer: BLUE CROSS/BLUE SHIELD | Admitting: Family Medicine

## 2019-09-29 ENCOUNTER — Encounter: Payer: Self-pay | Admitting: Family Medicine

## 2019-09-29 DIAGNOSIS — Z8616 Personal history of COVID-19: Secondary | ICD-10-CM

## 2019-09-29 DIAGNOSIS — E119 Type 2 diabetes mellitus without complications: Secondary | ICD-10-CM | POA: Insufficient documentation

## 2019-09-29 DIAGNOSIS — Z8619 Personal history of other infectious and parasitic diseases: Secondary | ICD-10-CM

## 2019-09-29 DIAGNOSIS — E1165 Type 2 diabetes mellitus with hyperglycemia: Secondary | ICD-10-CM | POA: Insufficient documentation

## 2019-09-29 HISTORY — DX: Personal history of COVID-19: Z86.16

## 2019-09-29 MED ORDER — ALBUTEROL SULFATE HFA 108 (90 BASE) MCG/ACT IN AERS: 2.0000 | INHALATION_SPRAY | Freq: Four times a day (QID) | RESPIRATORY_TRACT | 1 refills | Status: DC | PRN

## 2019-09-29 NOTE — Progress Notes (Signed)
Virtual Visit via Video   I connected with patient on 09/29/19 at  3:30 PM EST by a video enabled telemedicine application and verified that I am speaking with the correct person using two identifiers.  Location patient: Home Location provider: Acupuncturist, Office Persons participating in the virtual visit: Patient, Provider, [place] (Jess B)  I discussed the limitations of evaluation and management by telemedicine and the availability of in person appointments. The patient expressed understanding and agreed to proceed.  Subjective:   HPI:   DM- dx'd at last visit.  A1C 7.0  Did not start Metformin- has attempted to lose w/ diet and exercise.  Since dx pt has lost 20 lbs and has 'completely changed diet'.  Was walking 'as much as possible' prior to [place].  On ARB for renal protection.  Due for eye exam and foot exam.  CBGs 75-90  COVID- dx'd 11/4.  Pt reports some days he feels 'extremely good' and then other days 'I walk 30 feet and i'm winded'.  Also having back pain (as is son and daughter who are also +).  Has some intermittent chest tightness- improves w/ albuterol.  No redness or swelling of legs but does have some thigh pain.  ROS:   See pertinent positives and negatives per HPI.  Patient Active Problem List   Diagnosis Date Noted  . Diabetes mellitus without complication ([place]) 123XX123  . Anxiety and depression 02/07/2018  . Allergic rhinitis 03/01/2017  . Skin lesion of face 09/05/2015  . Upper airway cough syndrome 06/04/2015  . Solitary pulmonary nodule 06/04/2015  . Hematuria 05/20/2015  . Abdominal pain, epigastric 11/01/2013  . OSA (obstructive sleep apnea) 03/30/2013  . Routine general medical examination at a health care facility 12/27/2012  . Essential hypertension 12/20/2012  . Lipoma of back 12/20/2012  . Meralgia paresthetica of left side 12/20/2012  . Morbid obesity ([place]) 12/20/2012    Social History   Tobacco Use  . Smoking status: Never  Smoker  . Smokeless tobacco: Never Used  Substance Use Topics  . Alcohol use: No    Current Outpatient Medications:  .  amLODipine (NORVASC) 10 MG tablet, Take 1 tablet by mouth once daily, Disp: 90 tablet, Rfl: 0 .  aspirin EC 81 MG tablet, Take 81 mg by mouth as needed., Disp: , Rfl:  .  Aspirin-Acetaminophen-Caffeine (GOODY HEADACHE PO), Take 1 packet by mouth once as needed. For pain or headache  , Disp: , Rfl:  .  cetirizine (ZYRTEC) 10 MG tablet, Take 10 mg by mouth daily., Disp: , Rfl:  .  furosemide (LASIX) 20 MG tablet, Take 1 tablet by mouth once daily, Disp: 90 tablet, Rfl: 0 .  losartan (COZAAR) 100 MG tablet, Take 1 tablet by mouth once daily, Disp: 90 tablet, Rfl: 0 .  metFORMIN (GLUCOPHAGE) 500 MG tablet, Take 1 tablet (500 mg total) by mouth 2 (two) times daily with a meal., Disp: 180 tablet, Rfl: 1 .  sertraline (ZOLOFT) 25 MG tablet, Take 1 tablet by mouth once daily, Disp: 90 tablet, Rfl: 0 .  Triamcinolone Acetonide (NASACORT ALLERGY 24HR NA), Place into the nose., Disp: , Rfl:   Allergies  Allergen Reactions  . Enalapril Maleate Cough  . Codeine Nausea And Vomiting  . Hydrochlorothiazide Nausea Only  . Sulfa Antibiotics Rash    Ace cough    Objective:   There were no vitals taken for this visit. AAOx3, NAD Obese NCAT, EOMI No obvious CN deficits Coloring WNL Pt is able  to speak clearly, coherently without shortness of breath or increased work of breathing.  Thought process is linear.  Mood is appropriate.   Assessment and Plan:   DM- new dx at last visit.  Has not started Metformin.  Is down 20 lbs by completely changing diet.  Applauded his efforts.  Unable to do foot exam virtually.  Discussed importance of eye exam.  Will get labs to determine if things are improving and adjust tx prn.  COVID- new.  pt is recovering.  Some days feels 'really really good' and other days still struggles and has chest tightness.  Inhaler sent to pharmacy for pt to use.   If chest tightness doesn't improve by next week, will refer to post-COVID clinic at pulmonary.  Reviewed sxs that should prompt immediate medical attention and encouraged him to watch for a back slide or worsening.  Pt expressed understanding and is in agreement w/ plan.    Annye Asa, MD 09/29/2019

## 2019-09-29 NOTE — Progress Notes (Signed)
I have discussed the procedure for the virtual visit with the patient who has given consent to proceed with assessment and treatment.   Chandler Swiderski L Vinisha Faxon, CMA     

## 2019-11-09 ENCOUNTER — Other Ambulatory Visit: Payer: Self-pay | Admitting: Family Medicine

## 2019-11-30 ENCOUNTER — Other Ambulatory Visit: Payer: Self-pay | Admitting: Family Medicine

## 2019-12-14 ENCOUNTER — Other Ambulatory Visit: Payer: Self-pay | Admitting: Family Medicine

## 2020-02-14 ENCOUNTER — Other Ambulatory Visit: Payer: Self-pay | Admitting: Family Medicine

## 2020-03-06 ENCOUNTER — Other Ambulatory Visit: Payer: Self-pay | Admitting: Family Medicine

## 2020-05-23 ENCOUNTER — Other Ambulatory Visit: Payer: Self-pay | Admitting: Family Medicine

## 2020-05-24 ENCOUNTER — Other Ambulatory Visit: Payer: Self-pay | Admitting: General Practice

## 2020-05-24 ENCOUNTER — Telehealth: Payer: Self-pay | Admitting: Family Medicine

## 2020-05-24 MED ORDER — ONETOUCH VERIO VI STRP: ORAL_STRIP | 12 refills | Status: DC

## 2020-05-24 MED ORDER — FUROSEMIDE 20 MG PO TABS: 20.0000 mg | ORAL_TABLET | Freq: Every day | ORAL | 0 refills | Status: DC

## 2020-05-24 MED ORDER — LOSARTAN POTASSIUM 100 MG PO TABS: 100.0000 mg | ORAL_TABLET | Freq: Every day | ORAL | 0 refills | Status: DC

## 2020-05-24 MED ORDER — METFORMIN HCL 500 MG PO TABS: 500.0000 mg | ORAL_TABLET | Freq: Two times a day (BID) | ORAL | 1 refills | Status: DC

## 2020-05-24 MED ORDER — SERTRALINE HCL 25 MG PO TABS: 25.0000 mg | ORAL_TABLET | Freq: Every day | ORAL | 0 refills | Status: DC

## 2020-05-24 MED ORDER — ONETOUCH ULTRASOFT LANCETS MISC
12 refills | Status: DC
Start: 2020-05-24 — End: 2024-04-07

## 2020-05-24 MED ORDER — AMLODIPINE BESYLATE 10 MG PO TABS: 10.0000 mg | ORAL_TABLET | Freq: Every day | ORAL | 0 refills | Status: DC

## 2020-05-24 NOTE — Telephone Encounter (Signed)
Patient states that his refill request was denied - He states that he needs all his medications sent in because he is almost completely out.  He made an appointment for 06/07/2020.  Patient would like you to call him.

## 2020-05-24 NOTE — Telephone Encounter (Signed)
Med filled since pt made an appt. Will call to inform him.

## 2020-06-07 ENCOUNTER — Other Ambulatory Visit: Payer: Self-pay

## 2020-06-07 ENCOUNTER — Encounter: Payer: Self-pay | Admitting: Family Medicine

## 2020-06-07 ENCOUNTER — Ambulatory Visit: Payer: BC Managed Care – PPO | Admitting: Family Medicine

## 2020-06-07 VITALS — BP 130/85 | HR 86 | Temp 98.9°F | Resp 16 | Ht 63.0 in | Wt 254.5 lb

## 2020-06-07 DIAGNOSIS — E119 Type 2 diabetes mellitus without complications: Secondary | ICD-10-CM | POA: Diagnosis not present

## 2020-06-07 DIAGNOSIS — I10 Essential (primary) hypertension: Secondary | ICD-10-CM | POA: Diagnosis not present

## 2020-06-07 NOTE — Assessment & Plan Note (Signed)
Chronic problem, adequate control.  Currently asymptomatic.  Check labs.  No anticipated med changes.  Will follow.

## 2020-06-07 NOTE — Assessment & Plan Note (Signed)
Chronic problem.  Pt has not followed up as directed.  Due for eye exam- pt to schedule.  Foot exam done today.  On ARB for renal protection.  Has completely changed his diet.  Applauded his healthier choices.  Check labs.  Adjust meds prn

## 2020-06-07 NOTE — Assessment & Plan Note (Signed)
Pt is down 15 lbs since last visit by making dramatic dietary changes.  Applauded his efforts and encouraged him to add regular physical activity.  Will continue to follow.

## 2020-06-07 NOTE — Patient Instructions (Signed)
Schedule your complete physical in 3-4 months We'll notify you of your lab results and make any changes if needed Keep up the good work on healthy diet and add some regular exercise when able Call and schedule your eye exam at your convenience Call with any questions or concerns I am SO proud of the changes!!!

## 2020-06-07 NOTE — Progress Notes (Signed)
   Subjective:    Patient ID: Jacob Crawford, male    DOB: [birth year], 64 y.o.   MRN: [id number]  HPI DM- chronic problem, on Metformin 500mg  BID.  On ARB for renal protection.  Due for foot exam, eye exam.  Denies symptomatic lows, no numbness/tingling of hands/feet.  No abd pain, N/V.  CBG 107 this AM.  93 prior to visit.  HTN- chronic problem, on Amlodipine 10mg  daily, Losartan 100mg  daily, and Lasix 20mg  daily w/ adequate control.  Denies CP, SOB, HAs, visual changes, edema.  Morbid obesity- pt has lost 15 lbs since last visit.  BMI is 45.08.  'I completely changed my diet'.  Has increased chicken, decreased beef intake, dramatically decreased bread intake, has eliminated added sugars.  Has decreased potato and rice intake.  Now cooks his own meals.   Review of Systems For ROS see HPI   This visit occurred during the SARS-CoV-2 public health emergency.  Safety protocols were in place, including screening questions prior to the visit, additional usage of staff PPE, and extensive cleaning of exam room while observing appropriate contact time as indicated for disinfecting solutions.       Objective:   Physical Exam Vitals reviewed.  Constitutional:      General: He is not in acute distress.    Appearance: He is well-developed. He is obese.  HENT:     Head: Normocephalic and atraumatic.  Eyes:     Conjunctiva/sclera: Conjunctivae normal.     Pupils: Pupils are equal, round, and reactive to light.  Neck:     Thyroid: No thyromegaly.  Cardiovascular:     Rate and Rhythm: Normal rate and regular rhythm.     Heart sounds: Normal heart sounds. No murmur heard.   Pulmonary:     Effort: Pulmonary effort is normal. No respiratory distress.     Breath sounds: Normal breath sounds.  Abdominal:     General: Bowel sounds are normal. There is no distension.     Palpations: Abdomen is soft.  Musculoskeletal:     Cervical back: Normal range of motion and neck supple.  Lymphadenopathy:      Cervical: No cervical adenopathy.  Skin:    General: Skin is warm and dry.  Neurological:     Mental Status: He is alert and oriented to person, place, and time.     Cranial Nerves: No cranial nerve deficit.  Psychiatric:        Behavior: Behavior normal.           Assessment & Plan:

## 2020-06-08 LAB — HEPATIC FUNCTION PANEL
AG Ratio: 1.6 (calc) (ref 1.0–2.5)
ALT: 39 U/L (ref 9–46)
AST: 23 U/L (ref 10–35)
Albumin: 4.6 g/dL (ref 3.6–5.1)
Alkaline phosphatase (APISO): 74 U/L (ref 35–144)
Bilirubin, Direct: 0.1 mg/dL (ref 0.0–0.2)
Globulin: 2.8 g/dL (calc) (ref 1.9–3.7)
Indirect Bilirubin: 0.4 mg/dL (calc) (ref 0.2–1.2)
Total Bilirubin: 0.5 mg/dL (ref 0.2–1.2)
Total Protein: 7.4 g/dL (ref 6.1–8.1)

## 2020-06-08 LAB — CBC WITH DIFFERENTIAL/PLATELET
Absolute Monocytes: 428 cells/uL (ref 200–950)
Basophils Absolute: 32 cells/uL (ref 0–200)
Basophils Relative: 0.5 %
Eosinophils Absolute: 63 cells/uL (ref 15–500)
Eosinophils Relative: 1 %
HCT: 50 % (ref 38.5–50.0)
Hemoglobin: 16.9 g/dL (ref 13.2–17.1)
Lymphs Abs: 1329 cells/uL (ref 850–3900)
MCH: 30.4 pg (ref 27.0–33.0)
MCHC: 33.8 g/dL (ref 32.0–36.0)
MCV: 89.9 fL (ref 80.0–100.0)
MPV: 9.7 fL (ref 7.5–12.5)
Monocytes Relative: 6.8 %
Neutro Abs: 4448 cells/uL (ref 1500–7800)
Neutrophils Relative %: 70.6 %
Platelets: 197 10*3/uL (ref 140–400)
RBC: 5.56 10*6/uL (ref 4.20–5.80)
RDW: 12.7 % (ref 11.0–15.0)
Total Lymphocyte: 21.1 %
WBC: 6.3 10*3/uL (ref 3.8–10.8)

## 2020-06-08 LAB — BASIC METABOLIC PANEL
BUN: 14 mg/dL (ref 7–25)
CO2: 24 mmol/L (ref 20–32)
Calcium: 9.8 mg/dL (ref 8.6–10.3)
Chloride: 104 mmol/L (ref 98–110)
Creat: 0.95 mg/dL (ref 0.70–1.25)
Glucose, Bld: 89 mg/dL (ref 65–99)
Potassium: 4.3 mmol/L (ref 3.5–5.3)
Sodium: 142 mmol/L (ref 135–146)

## 2020-06-08 LAB — HEMOGLOBIN A1C
Hgb A1c MFr Bld: 5.4 % of total Hgb (ref <5.7)
Mean Plasma Glucose: 108 (calc)
eAG (mmol/L): 6 (calc)

## 2020-06-08 LAB — LIPID PANEL
Cholesterol: 144 mg/dL (ref <200)
HDL: 44 mg/dL (ref >=40)
LDL Cholesterol (Calc): 81 mg/dL (calc)
Non-HDL Cholesterol (Calc): 100 mg/dL (calc) (ref <130)
Total CHOL/HDL Ratio: 3.3 (calc) (ref <5.0)
Triglycerides: 94 mg/dL (ref <150)

## 2020-06-08 LAB — TSH: TSH: 3.39 mIU/L (ref 0.40–4.50)

## 2020-06-10 ENCOUNTER — Encounter: Payer: Self-pay | Admitting: General Practice

## 2020-09-04 ENCOUNTER — Other Ambulatory Visit: Payer: Self-pay | Admitting: Family Medicine

## 2020-09-13 ENCOUNTER — Encounter: Payer: BC Managed Care – PPO | Admitting: Family Medicine

## 2020-09-16 ENCOUNTER — Ambulatory Visit (INDEPENDENT_AMBULATORY_CARE_PROVIDER_SITE_OTHER): Payer: BC Managed Care – PPO | Admitting: Family Medicine

## 2020-09-16 ENCOUNTER — Other Ambulatory Visit: Payer: Self-pay

## 2020-09-16 ENCOUNTER — Encounter: Payer: Self-pay | Admitting: Family Medicine

## 2020-09-16 VITALS — BP 128/74 | HR 61 | Temp 98.0°F | Resp 17 | Ht 64.0 in | Wt 253.6 lb

## 2020-09-16 DIAGNOSIS — Z1211 Encounter for screening for malignant neoplasm of colon: Secondary | ICD-10-CM

## 2020-09-16 DIAGNOSIS — E119 Type 2 diabetes mellitus without complications: Secondary | ICD-10-CM | POA: Diagnosis not present

## 2020-09-16 DIAGNOSIS — Z125 Encounter for screening for malignant neoplasm of prostate: Secondary | ICD-10-CM

## 2020-09-16 DIAGNOSIS — Z0000 Encounter for general adult medical examination without abnormal findings: Secondary | ICD-10-CM | POA: Diagnosis not present

## 2020-09-16 LAB — PSA: PSA: 1.88 ng/mL (ref 0.10–4.00)

## 2020-09-16 LAB — HEPATIC FUNCTION PANEL
ALT: 35 U/L (ref 0–53)
AST: 20 U/L (ref 0–37)
Albumin: 4.3 g/dL (ref 3.5–5.2)
Alkaline Phosphatase: 82 U/L (ref 39–117)
Bilirubin, Direct: 0.1 mg/dL (ref 0.0–0.3)
Total Bilirubin: 0.4 mg/dL (ref 0.2–1.2)
Total Protein: 6.8 g/dL (ref 6.0–8.3)

## 2020-09-16 LAB — HEMOGLOBIN A1C: Hgb A1c MFr Bld: 5.6 % (ref 4.6–6.5)

## 2020-09-16 LAB — CBC WITH DIFFERENTIAL/PLATELET
Basophils Absolute: 0 10*3/uL (ref 0.0–0.1)
Basophils Relative: 0.6 % (ref 0.0–3.0)
Eosinophils Absolute: 0.1 10*3/uL (ref 0.0–0.7)
Eosinophils Relative: 1.6 % (ref 0.0–5.0)
HCT: 46.4 % (ref 39.0–52.0)
Hemoglobin: 15.8 g/dL (ref 13.0–17.0)
Lymphocytes Relative: 23.8 % (ref 12.0–46.0)
Lymphs Abs: 1.2 10*3/uL (ref 0.7–4.0)
MCHC: 34 g/dL (ref 30.0–36.0)
MCV: 88.6 fl (ref 78.0–100.0)
Monocytes Absolute: 0.4 10*3/uL (ref 0.1–1.0)
Monocytes Relative: 8 % (ref 3.0–12.0)
Neutro Abs: 3.3 10*3/uL (ref 1.4–7.7)
Neutrophils Relative %: 66 % (ref 43.0–77.0)
Platelets: 180 10*3/uL (ref 150.0–400.0)
RBC: 5.24 Mil/uL (ref 4.22–5.81)
RDW: 13.8 % (ref 11.5–15.5)
WBC: 5.1 10*3/uL (ref 4.0–10.5)

## 2020-09-16 LAB — BASIC METABOLIC PANEL
BUN: 14 mg/dL (ref 6–23)
CO2: 23 mEq/L (ref 19–32)
Calcium: 8.9 mg/dL (ref 8.4–10.5)
Chloride: 109 mEq/L (ref 96–112)
Creatinine, Ser: 0.85 mg/dL (ref 0.40–1.50)
GFR: 93.55 mL/min (ref >60.00)
Glucose, Bld: 99 mg/dL (ref 70–99)
Potassium: 3.9 mEq/L (ref 3.5–5.1)
Sodium: 143 mEq/L (ref 135–145)

## 2020-09-16 LAB — LIPID PANEL
Cholesterol: 128 mg/dL (ref 0–200)
HDL: 37.5 mg/dL — ABNORMAL LOW (ref >39.00)
LDL Cholesterol: 77 mg/dL (ref 0–99)
NonHDL: 90.22
Total CHOL/HDL Ratio: 3
Triglycerides: 64 mg/dL (ref 0.0–149.0)
VLDL: 12.8 mg/dL (ref 0.0–40.0)

## 2020-09-16 LAB — TSH: TSH: 3.36 u[IU]/mL (ref 0.35–4.50)

## 2020-09-16 NOTE — Assessment & Plan Note (Signed)
Pt's PE WNL w/ exception of obesity.  Due for colonoscopy- referral placed (needs to be WF based on insurance).  UTD on immunizations.  Encouraged him to get COVID booster due to DM.  Stressed need for healthy diet and regular exercise.  Check labs.  Anticipatory guidance provided.

## 2020-09-16 NOTE — Patient Instructions (Signed)
Follow up in 3-4 months to recheck diabetes We'll notify you of your lab results and make any changes if needed Continue to work on healthy diet and regular exercise- you can do it! We'll call you with your GI and Cardiology referrals [facility]) Call with any questions or concerns Stay Safe!  Stay Healthy! Happy Holidays!!

## 2020-09-16 NOTE — Assessment & Plan Note (Signed)
Chronic problem.  UTD on foot exam, eye exam, on ARB for renal protection.  Check labs.  Adjust meds prn

## 2020-09-16 NOTE — Progress Notes (Signed)
   Subjective:    Patient ID: Jacob Crawford, male    DOB: 05-27-1956, 64 y.o.   MRN: [id number]  HPI CPE- UTD on Tdap, COVID vaccines, flu shot.  Due for colonoscopy- pt reports during last procedure 'they were real concerned about putting me under with my sleep apnea'.  Reviewed past medical, surgical, family and social histories.   Patient Care Team    Relationship Specialty Notifications Start End  Midge Minium, MD PCP - General Family Medicine  12/20/12   Sable Feil, MD Consulting Physician Gastroenterology  09/05/15   Tanda Rockers, MD Consulting Physician Pulmonary Disease  09/05/15   Christy Sartorius, MD Referring Physician Urology  09/05/15     Health Maintenance  Topic Date Due  . PNEUMOCOCCAL POLYSACCHARIDE VACCINE AGE 84-64 HIGH RISK  06/07/2021 (Originally 11/01/1960)  . HIV Screening  06/07/2021 (Originally 11/01/1973)  . COLONOSCOPY  09/16/2021 (Originally 02/17/2018)  . HEMOGLOBIN A1C  12/08/2020  . OPHTHALMOLOGY EXAM  05/21/2021  . FOOT EXAM  06/07/2021  . TETANUS/TDAP  01/27/2028  . INFLUENZA VACCINE  Completed  . COVID-19 Vaccine  Completed  . Hepatitis C Screening  Completed      Review of Systems Patient reports no vision/hearing changes, anorexia, fever ,adenopathy, persistant/recurrent hoarseness, swallowing issues, chest pain, palpitations, edema, persistant/recurrent cough, hemoptysis, dyspnea (rest,exertional, paroxysmal nocturnal), gastrointestinal  bleeding (melena, rectal bleeding), abdominal pain, excessive heart burn, GU symptoms (dysuria, hematuria, voiding/incontinence issues) syncope, focal weakness, memory loss, numbness & tingling, skin/hair/nail changes, depression, anxiety, abnormal bruising/bleeding, musculoskeletal symptoms/signs.   This visit occurred during the SARS-CoV-2 public health emergency.  Safety protocols were in place, including screening questions prior to the visit, additional usage of staff PPE, and extensive  cleaning of exam room while observing appropriate contact time as indicated for disinfecting solutions.       Objective:   Physical Exam General Appearance:    Alert, cooperative, no distress, appears stated age, obese  Head:    Normocephalic, without obvious abnormality, atraumatic  Eyes:    PERRL, conjunctiva/corneas clear, EOM's intact, fundi    benign, both eyes       Ears:    Normal TM's and external ear canals, both ears  Nose:   Deferred due to COVID  Throat:   Neck:   Supple, symmetrical, trachea midline, no adenopathy;       thyroid:  No enlargement/tenderness/nodules  Back:     Symmetric, no curvature, ROM normal, no CVA tenderness  Lungs:     Clear to auscultation bilaterally, respirations unlabored  Chest wall:    No tenderness or deformity  Heart:    Regular rate and rhythm, S1 and S2 normal, no murmur, rub   or gallop  Abdomen:     Soft, non-tender, bowel sounds active all four quadrants,    no masses, no organomegaly  Genitalia:    deferred  Rectal:    Extremities:   Extremities normal, atraumatic, no cyanosis or edema  Pulses:   2+ and symmetric all extremities  Skin:   Skin color, texture, turgor normal, no rashes or lesions  Lymph nodes:   Cervical, supraclavicular, and axillary nodes normal  Neurologic:   CNII-XII intact. Normal strength, sensation and reflexes      throughout          Assessment & Plan:

## 2020-11-22 DIAGNOSIS — Z20822 Contact with and (suspected) exposure to covid-19: Secondary | ICD-10-CM | POA: Diagnosis not present

## 2020-11-29 ENCOUNTER — Other Ambulatory Visit: Payer: Self-pay | Admitting: Family Medicine

## 2020-12-02 DIAGNOSIS — U071 COVID-19: Secondary | ICD-10-CM | POA: Diagnosis not present

## 2020-12-02 DIAGNOSIS — R059 Cough, unspecified: Secondary | ICD-10-CM | POA: Diagnosis not present

## 2020-12-12 ENCOUNTER — Other Ambulatory Visit: Payer: Self-pay | Admitting: Family Medicine

## 2020-12-28 DIAGNOSIS — J40 Bronchitis, not specified as acute or chronic: Secondary | ICD-10-CM | POA: Diagnosis not present

## 2021-01-04 DIAGNOSIS — I10 Essential (primary) hypertension: Secondary | ICD-10-CM | POA: Diagnosis not present

## 2021-01-04 DIAGNOSIS — S61451A Open bite of right hand, initial encounter: Secondary | ICD-10-CM | POA: Diagnosis not present

## 2021-01-04 DIAGNOSIS — W540XXA Bitten by dog, initial encounter: Secondary | ICD-10-CM | POA: Diagnosis not present

## 2021-01-04 DIAGNOSIS — S61411A Laceration without foreign body of right hand, initial encounter: Secondary | ICD-10-CM | POA: Diagnosis not present

## 2021-01-17 ENCOUNTER — Other Ambulatory Visit: Payer: Self-pay

## 2021-01-17 ENCOUNTER — Ambulatory Visit: Payer: BC Managed Care – PPO | Admitting: Family Medicine

## 2021-01-17 ENCOUNTER — Encounter: Payer: Self-pay | Admitting: Family Medicine

## 2021-01-17 VITALS — BP 128/78 | HR 77 | Temp 97.2°F | Resp 18 | Ht 64.0 in | Wt 255.8 lb

## 2021-01-17 DIAGNOSIS — E119 Type 2 diabetes mellitus without complications: Secondary | ICD-10-CM | POA: Diagnosis not present

## 2021-01-17 NOTE — Assessment & Plan Note (Signed)
Ongoing issue for pt.  BMI is 43.9  He admits that when he was sick w/ COVID he ate whatever was available/comfortable.  He plans to get back on his strict diet.  Will follow.

## 2021-01-17 NOTE — Assessment & Plan Note (Signed)
Chronic problem.  Never started the Metformin.  On ARB for renal protection.  UTD on eye exam.  Stressed need for low carb diet and regular exercise.  Check labs and determine if medication is needed

## 2021-01-17 NOTE — Patient Instructions (Signed)
Follow up in 3-4 months to recheck sugar and BP We'll notify you of your lab results and make any changes if needed Continue to work on healthy diet and regular exercise- you can do it! Call with any questions or concerns GOOD LUCK ON THE NEW JOB!!!!

## 2021-01-17 NOTE — Progress Notes (Signed)
   Subjective:    Patient ID: MATHIAS BOGACKI, male    DOB: 12-25-1955, 65 y.o.   MRN: [id number]  HPI DM- chronic problem, never started Metformin.  Last A1C 5.6%  On ARB for renal protection.  UTD on eye exam, foot exam.  No CP, SOB, HAs, visual changes, edema.  No symptomatic lows.  Denies numbness/tingling of hands/feet  Obesity- BMI is 43.91  No regular exercise.  Admits to going off his low carb diet when he had COVID.   Review of Systems For ROS see HPI   This visit occurred during the SARS-CoV-2 public health emergency.  Safety protocols were in place, including screening questions prior to the visit, additional usage of staff PPE, and extensive cleaning of exam room while observing appropriate contact time as indicated for disinfecting solutions.       Objective:   Physical Exam Vitals reviewed.  Constitutional:      General: He is not in acute distress.    Appearance: Normal appearance. He is well-developed. He is obese.  HENT:     Head: Normocephalic and atraumatic.  Eyes:     Conjunctiva/sclera: Conjunctivae normal.     Pupils: Pupils are equal, round, and reactive to light.  Neck:     Thyroid: No thyromegaly.  Cardiovascular:     Rate and Rhythm: Normal rate and regular rhythm.     Pulses: Normal pulses.     Heart sounds: Normal heart sounds. No murmur heard.   Pulmonary:     Effort: Pulmonary effort is normal. No respiratory distress.     Breath sounds: Normal breath sounds.  Abdominal:     General: Bowel sounds are normal. There is no distension.     Palpations: Abdomen is soft.  Musculoskeletal:     Cervical back: Normal range of motion and neck supple.     Right lower leg: No edema.     Left lower leg: No edema.  Lymphadenopathy:     Cervical: No cervical adenopathy.  Skin:    General: Skin is warm and dry.  Neurological:     Mental Status: He is alert and oriented to person, place, and time.     Cranial Nerves: No cranial nerve deficit.   Psychiatric:        Behavior: Behavior normal.           Assessment & Plan:

## 2021-01-18 LAB — BASIC METABOLIC PANEL
BUN: 15 mg/dL (ref 7–25)
CO2: 25 mmol/L (ref 20–32)
Calcium: 9.2 mg/dL (ref 8.6–10.3)
Chloride: 107 mmol/L (ref 98–110)
Creat: 0.88 mg/dL (ref 0.70–1.25)
Glucose, Bld: 79 mg/dL (ref 65–99)
Potassium: 3.7 mmol/L (ref 3.5–5.3)
Sodium: 144 mmol/L (ref 135–146)

## 2021-01-18 LAB — HEMOGLOBIN A1C
Hgb A1c MFr Bld: 5.6 % of total Hgb (ref <5.7)
Mean Plasma Glucose: 114 mg/dL
eAG (mmol/L): 6.3 mmol/L

## 2021-01-27 DIAGNOSIS — Z8249 Family history of ischemic heart disease and other diseases of the circulatory system: Secondary | ICD-10-CM | POA: Diagnosis not present

## 2021-01-27 DIAGNOSIS — I10 Essential (primary) hypertension: Secondary | ICD-10-CM | POA: Diagnosis not present

## 2021-01-27 DIAGNOSIS — E669 Obesity, unspecified: Secondary | ICD-10-CM | POA: Diagnosis not present

## 2021-01-27 DIAGNOSIS — R9431 Abnormal electrocardiogram [ECG] [EKG]: Secondary | ICD-10-CM | POA: Diagnosis not present

## 2021-02-07 DIAGNOSIS — I422 Other hypertrophic cardiomyopathy: Secondary | ICD-10-CM | POA: Diagnosis not present

## 2021-02-27 ENCOUNTER — Other Ambulatory Visit: Payer: Self-pay | Admitting: Family Medicine

## 2021-05-07 ENCOUNTER — Encounter: Payer: Self-pay | Admitting: *Deleted

## 2021-05-16 ENCOUNTER — Telehealth (INDEPENDENT_AMBULATORY_CARE_PROVIDER_SITE_OTHER): Payer: Self-pay | Admitting: Family Medicine

## 2021-05-16 ENCOUNTER — Other Ambulatory Visit: Payer: Self-pay

## 2021-05-16 ENCOUNTER — Encounter: Payer: Self-pay | Admitting: Family Medicine

## 2021-05-16 DIAGNOSIS — I10 Essential (primary) hypertension: Secondary | ICD-10-CM

## 2021-05-16 DIAGNOSIS — E119 Type 2 diabetes mellitus without complications: Secondary | ICD-10-CM

## 2021-05-16 NOTE — Progress Notes (Signed)
Virtual Visit via Video   I connected with patient on 05/16/21 at  2:00 PM EDT by a video enabled telemedicine application and verified that I am speaking with the correct person using two identifiers.  Location patient: Home Location provider: [facility], Office Persons participating in the virtual visit: Patient, Provider, [place] (Sabrina M)  I discussed the limitations of evaluation and management by telemedicine and the availability of in person appointments. The patient expressed understanding and agreed to proceed.  Subjective:   HPI:   DM- chronic problem.  Has been able to control w/ diet thus far.  UTD on eye exam, foot exam (both are coming due) and on ARB for renal protection.  Pt has been actively sticking to low carb diet.  No abd pain, N/V.  No numbness/tingling of hands/feet.  HTN- chronic problem, on Amlodipine 10mg  daily, Losartan 100mg  daily, and Lasix 20mg  daily w/ hx of good control.  No CP, SOB, HAs, visual changes, edema.  Obesity- ongoing issue for pt.  Job is very active but no formal exercise.  He is concerned about his obesity and asking about weight loss medications/plans  ROS:   See pertinent positives and negatives per HPI.  Patient Active Problem List   Diagnosis Date Noted   Diabetes mellitus without complication ([place]) 67/89/3810   History of 2019 novel coronavirus disease (COVID-19) 09/29/2019   Anxiety and depression 02/07/2018   Allergic rhinitis 03/01/2017   Seasonal allergic rhinitis due to pollen 03/01/2017   Skin lesion of face 09/05/2015   Upper airway cough syndrome 06/04/2015   Solitary pulmonary nodule 06/04/2015   Hematuria 05/20/2015   Abdominal pain, epigastric 11/01/2013   OSA (obstructive sleep apnea) 03/30/2013   Routine general medical examination at a health care facility 12/27/2012   Essential hypertension 12/20/2012   Lipoma of back 12/20/2012   Meralgia paresthetica of left side 12/20/2012   Morbid obesity ([place])  12/20/2012    Social History   Tobacco Use   Smoking status: Never   Smokeless tobacco: Never  Substance Use Topics   Alcohol use: No    Current Outpatient Medications:    albuterol (VENTOLIN HFA) 108 (90 Base) MCG/ACT inhaler, INHALE 2 PUFFS BY MOUTH EVERY 6 HOURS AS NEEDED FOR WHEEZING FOR SHORTNESS OF BREATH, Disp: 18 g, Rfl: 0   amLODipine (NORVASC) 10 MG tablet, Take 1 tablet by mouth once daily, Disp: 90 tablet, Rfl: 0   Aspirin-Acetaminophen-Caffeine (GOODY HEADACHE PO), Take 1 packet by mouth once as needed. For pain or headache, Disp: , Rfl:    furosemide (LASIX) 20 MG tablet, Take 1 tablet by mouth once daily, Disp: 90 tablet, Rfl: 0   glucose blood (ONETOUCH VERIO) test strip, Use as instructed to test sugars 1-2 times daily. Dx. E11.9, Disp: 100 each, Rfl: 12   Lancets (ONETOUCH ULTRASOFT) lancets, Use as instructed to test sugars 1-2 times daily. Dx E11.9, Disp: 100 each, Rfl: 12   losartan (COZAAR) 100 MG tablet, Take 1 tablet by mouth once daily, Disp: 90 tablet, Rfl: 0   sertraline (ZOLOFT) 25 MG tablet, Take 1 tablet by mouth once daily, Disp: 90 tablet, Rfl: 0   Triamcinolone Acetonide (NASACORT ALLERGY 24HR NA), Place into the nose., Disp: , Rfl:    amoxicillin-clavulanate (AUGMENTIN) 875-125 MG tablet, Take 1 tablet by mouth 2 (two) times daily. (Patient not taking: Reported on 05/16/2021), Disp: , Rfl:    aspirin EC 81 MG tablet, Take 81 mg by mouth as needed. (Patient not taking: No  sig reported), Disp: , Rfl:    cetirizine (ZYRTEC) 10 MG tablet, Take 10 mg by mouth daily. (Patient not taking: No sig reported), Disp: , Rfl:   Allergies  Allergen Reactions   Enalapril Maleate Cough   Codeine Nausea And Vomiting   Hydrochlorothiazide Nausea Only   Sulfa Antibiotics Rash    Ace cough    Objective:   There were no vitals taken for this visit. AAOx3, NAD NCAT, EOMI No obvious CN deficits Coloring WNL Pt is able to speak clearly, coherently without shortness of  breath or increased work of breathing.  Thought process is linear.  Mood is appropriate.   Assessment and Plan:   DM- ongoing issue.  Pt has been able to control w/ diet thus far.  UTD on eye exam and foot exam although both are coming due.  On ARB for renal protection.  Pt will come for labs when he is back in town.  HTN- chronic problem.  Hx of adequate control on Amlodipine, Losartan, and Lasix.  Currently asymptomatic.  Check labs.  No anticipated med changes.  Obesity- pt reports he has gained weight since last visit and this is upsetting to him.  H is aware that this is an issue and wants to commit to losing weight.  We discussed Plentity- the new FDA approved supplement that decreases hunger when taken before each meal.  We talked about [employer]- which contains a similar supplement but also has coaching and meal plans.  We discussed the need for regular exercise.  Pt to do some investigating and cost comparison and let me know how he wants to proceed.   Annye Asa, MD 05/16/2021

## 2021-05-16 NOTE — Progress Notes (Signed)
I connected with  Laurian Brim on 05/16/21 by a video enabled telemedicine application and verified that I am speaking with the correct person using two identifiers.   I discussed the limitations of evaluation and management by telemedicine. The patient expressed understanding and agreed to proceed.

## 2021-05-29 ENCOUNTER — Other Ambulatory Visit: Payer: Self-pay | Admitting: Family Medicine

## 2021-05-30 ENCOUNTER — Other Ambulatory Visit (INDEPENDENT_AMBULATORY_CARE_PROVIDER_SITE_OTHER): Payer: Commercial Managed Care - PPO

## 2021-05-30 ENCOUNTER — Other Ambulatory Visit: Payer: Self-pay

## 2021-05-30 DIAGNOSIS — E119 Type 2 diabetes mellitus without complications: Secondary | ICD-10-CM

## 2021-05-30 DIAGNOSIS — I10 Essential (primary) hypertension: Secondary | ICD-10-CM

## 2021-05-31 LAB — HEMOGLOBIN A1C
Hgb A1c MFr Bld: 5.8 % of total Hgb — ABNORMAL HIGH (ref <5.7)
Mean Plasma Glucose: 120 mg/dL
eAG (mmol/L): 6.6 mmol/L

## 2021-05-31 LAB — CBC WITH DIFFERENTIAL/PLATELET
Absolute Monocytes: 428 cells/uL (ref 200–950)
Basophils Absolute: 40 cells/uL (ref 0–200)
Basophils Relative: 0.7 %
Eosinophils Absolute: 108 cells/uL (ref 15–500)
Eosinophils Relative: 1.9 %
HCT: 48.5 % (ref 38.5–50.0)
Hemoglobin: 16.6 g/dL (ref 13.2–17.1)
Lymphs Abs: 1590 cells/uL (ref 850–3900)
MCH: 31.1 pg (ref 27.0–33.0)
MCHC: 34.2 g/dL (ref 32.0–36.0)
MCV: 91 fL (ref 80.0–100.0)
MPV: 9.8 fL (ref 7.5–12.5)
Monocytes Relative: 7.5 %
Neutro Abs: 3534 cells/uL (ref 1500–7800)
Neutrophils Relative %: 62 %
Platelets: 206 10*3/uL (ref 140–400)
RBC: 5.33 10*6/uL (ref 4.20–5.80)
RDW: 13 % (ref 11.0–15.0)
Total Lymphocyte: 27.9 %
WBC: 5.7 10*3/uL (ref 3.8–10.8)

## 2021-05-31 LAB — BASIC METABOLIC PANEL
BUN: 12 mg/dL (ref 7–25)
CO2: 21 mmol/L (ref 20–32)
Calcium: 9.2 mg/dL (ref 8.6–10.3)
Chloride: 107 mmol/L (ref 98–110)
Creat: 0.94 mg/dL (ref 0.70–1.35)
Glucose, Bld: 82 mg/dL (ref 65–99)
Potassium: 3.8 mmol/L (ref 3.5–5.3)
Sodium: 139 mmol/L (ref 135–146)

## 2021-05-31 LAB — HEPATIC FUNCTION PANEL
AG Ratio: 1.7 (calc) (ref 1.0–2.5)
ALT: 39 U/L (ref 9–46)
AST: 22 U/L (ref 10–35)
Albumin: 4.5 g/dL (ref 3.6–5.1)
Alkaline phosphatase (APISO): 86 U/L (ref 35–144)
Bilirubin, Direct: 0.1 mg/dL (ref 0.0–0.2)
Globulin: 2.7 g/dL (calc) (ref 1.9–3.7)
Indirect Bilirubin: 0.6 mg/dL (calc) (ref 0.2–1.2)
Total Bilirubin: 0.7 mg/dL (ref 0.2–1.2)
Total Protein: 7.2 g/dL (ref 6.1–8.1)

## 2021-05-31 LAB — LIPID PANEL
Cholesterol: 151 mg/dL (ref <200)
HDL: 41 mg/dL (ref >=40)
LDL Cholesterol (Calc): 87 mg/dL (calc)
Non-HDL Cholesterol (Calc): 110 mg/dL (calc) (ref <130)
Total CHOL/HDL Ratio: 3.7 (calc) (ref <5.0)
Triglycerides: 131 mg/dL (ref <150)

## 2021-05-31 LAB — TSH: TSH: 3.03 mIU/L (ref 0.40–4.50)

## 2021-06-03 ENCOUNTER — Telehealth: Payer: Self-pay

## 2021-06-03 NOTE — Telephone Encounter (Signed)
Processed a PA for patient for Ventolin Aerosol inhaler HFA 108 90 base. Waiting for the approval/denial from [employer]

## 2021-06-07 ENCOUNTER — Other Ambulatory Visit: Payer: Self-pay

## 2021-06-07 ENCOUNTER — Emergency Department (HOSPITAL_BASED_OUTPATIENT_CLINIC_OR_DEPARTMENT_OTHER)
Admission: EM | Admit: 2021-06-07 | Discharge: 2021-06-07 | Disposition: A | Discharge status: Home / Self Care | Payer: Commercial Managed Care - PPO | Attending: Emergency Medicine | Admitting: Emergency Medicine

## 2021-06-07 ENCOUNTER — Emergency Department (HOSPITAL_BASED_OUTPATIENT_CLINIC_OR_DEPARTMENT_OTHER): Payer: Commercial Managed Care - PPO

## 2021-06-07 ENCOUNTER — Encounter (HOSPITAL_BASED_OUTPATIENT_CLINIC_OR_DEPARTMENT_OTHER): Payer: Self-pay | Admitting: Emergency Medicine

## 2021-06-07 DIAGNOSIS — R110 Nausea: Secondary | ICD-10-CM

## 2021-06-07 DIAGNOSIS — Z8616 Personal history of COVID-19: Secondary | ICD-10-CM | POA: Diagnosis not present

## 2021-06-07 DIAGNOSIS — Z86018 Personal history of other benign neoplasm: Secondary | ICD-10-CM | POA: Diagnosis not present

## 2021-06-07 DIAGNOSIS — Z79899 Other long term (current) drug therapy: Secondary | ICD-10-CM | POA: Diagnosis not present

## 2021-06-07 DIAGNOSIS — I10 Essential (primary) hypertension: Secondary | ICD-10-CM | POA: Insufficient documentation

## 2021-06-07 DIAGNOSIS — K7689 Other specified diseases of liver: Secondary | ICD-10-CM | POA: Diagnosis not present

## 2021-06-07 DIAGNOSIS — K769 Liver disease, unspecified: Secondary | ICD-10-CM

## 2021-06-07 DIAGNOSIS — E119 Type 2 diabetes mellitus without complications: Secondary | ICD-10-CM | POA: Diagnosis not present

## 2021-06-07 DIAGNOSIS — R1012 Left upper quadrant pain: Secondary | ICD-10-CM | POA: Diagnosis present

## 2021-06-07 DIAGNOSIS — R519 Headache, unspecified: Secondary | ICD-10-CM | POA: Insufficient documentation

## 2021-06-07 LAB — CBC WITH DIFFERENTIAL/PLATELET
Abs Immature Granulocytes: 0.02 10*3/uL (ref 0.00–0.07)
Basophils Absolute: 0.1 10*3/uL (ref 0.0–0.1)
Basophils Relative: 1 %
Eosinophils Absolute: 0.1 10*3/uL (ref 0.0–0.5)
Eosinophils Relative: 2 %
HCT: 47.1 % (ref 39.0–52.0)
Hemoglobin: 16.3 g/dL (ref 13.0–17.0)
Immature Granulocytes: 0 %
Lymphocytes Relative: 28 %
Lymphs Abs: 1.8 10*3/uL (ref 0.7–4.0)
MCH: 30.8 pg (ref 26.0–34.0)
MCHC: 34.6 g/dL (ref 30.0–36.0)
MCV: 89 fL (ref 80.0–100.0)
Monocytes Absolute: 0.6 10*3/uL (ref 0.1–1.0)
Monocytes Relative: 9 %
Neutro Abs: 4 10*3/uL (ref 1.7–7.7)
Neutrophils Relative %: 60 %
Platelets: 206 10*3/uL (ref 150–400)
RBC: 5.29 MIL/uL (ref 4.22–5.81)
RDW: 12.8 % (ref 11.5–15.5)
WBC: 6.6 10*3/uL (ref 4.0–10.5)
nRBC: 0 % (ref 0.0–0.2)

## 2021-06-07 LAB — BASIC METABOLIC PANEL
Anion gap: 10 (ref 5–15)
BUN: 15 mg/dL (ref 8–23)
CO2: 25 mmol/L (ref 22–32)
Calcium: 9.2 mg/dL (ref 8.9–10.3)
Chloride: 105 mmol/L (ref 98–111)
Creatinine, Ser: 0.77 mg/dL (ref 0.61–1.24)
GFR, Estimated: >60 mL/min (ref >60)
Glucose, Bld: 88 mg/dL (ref 70–99)
Potassium: 3.6 mmol/L (ref 3.5–5.1)
Sodium: 140 mmol/L (ref 135–145)

## 2021-06-07 LAB — HEPATIC FUNCTION PANEL
ALT: 44 U/L (ref 0–44)
AST: 26 U/L (ref 15–41)
Albumin: 4.1 g/dL (ref 3.5–5.0)
Alkaline Phosphatase: 68 U/L (ref 38–126)
Bilirubin, Direct: 0.2 mg/dL (ref 0.0–0.2)
Indirect Bilirubin: 0.2 mg/dL — ABNORMAL LOW (ref 0.3–0.9)
Total Bilirubin: 0.4 mg/dL (ref 0.3–1.2)
Total Protein: 7 g/dL (ref 6.5–8.1)

## 2021-06-07 LAB — TROPONIN I (HIGH SENSITIVITY)
Troponin I (High Sensitivity): 4 ng/L (ref <18)
Troponin I (High Sensitivity): 4 ng/L (ref <18)

## 2021-06-07 LAB — CK: Total CK: 83 U/L (ref 49–397)

## 2021-06-07 LAB — LIPASE, BLOOD: Lipase: 31 U/L (ref 11–51)

## 2021-06-07 MED ORDER — ONDANSETRON 4 MG PO TBDP: 4.0000 mg | ORAL_TABLET | Freq: Three times a day (TID) | ORAL | 0 refills | Status: DC | PRN

## 2021-06-07 MED ORDER — ACETAMINOPHEN 325 MG PO TABS
650.0000 mg | ORAL_TABLET | Freq: Once | ORAL | Status: AC
  Administered 2021-06-07: 650 mg via ORAL
  Filled 2021-06-07: qty 2

## 2021-06-07 NOTE — ED Triage Notes (Signed)
Pt reports nausea since last week; HA; c/o bil thighs "throbbing"; c/o pressure from LT ribs to shoulder blade

## 2021-06-07 NOTE — ED Notes (Addendum)
Patient reports nausea since last week, no vomiting.  Also felling pressure from subscapular area to shoulder on left, and mild headache (2/10).  Patient states his BP has been higher than normal lately and that it is normally well controlled with his BP meds and believes maybe he needs to have his meds adjusted.

## 2021-06-07 NOTE — Discharge Instructions (Addendum)
Contact a health care provider if: Your nausea gets worse. Your nausea does not go away after two days. You vomit. You cannot drink fluids without vomiting. You have any of the following: New symptoms. A fever. A headache. Muscle cramps. A rash. Pain while urinating. You feel light-headed or dizzy. Get help right away if: You have pain in your chest, neck, arm, or jaw. You feel extremely weak or you faint. You have vomit that is bright red or looks like coffee grounds. You have bloody or black stools or stools that look like tar. You have a severe headache, a stiff neck, or both. You have severe pain, cramping, or bloating in your abdomen. You have difficulty breathing or are breathing very quickly. Your heart is beating very quickly. Your skin feels cold and clammy. You feel confused. You have signs of dehydration, such as: Dark urine, very little urine, or no urine. Cracked lips. Dry mouth. Sunken eyes. Sleepiness. Weakness.

## 2021-06-07 NOTE — ED Provider Notes (Signed)
[facility] EMERGENCY DEPARTMENT Provider Note   CSN: [id number] Arrival date & time: 06/07/21  1740     History Chief Complaint  Patient presents with   Multiple Complaints    Jacob Crawford is a 65 y.o. male who presents with multiple complaints.  Patient states that he has been having intermittent episodes of nausea and mild headaches.  He complains of pain in the left upper quadrant that radiates around to his back.  He has not had any vomiting, fevers, chills, diarrhea, chest pain or shortness of breath.  He has a history of diabetes which is diet controlled.  He is also noticed that his blood pressure has been labile.  He has no previous history of hypertension.  The history is provided by the patient.      Past Medical History:  Diagnosis Date   Diabetes mellitus without complication ([place])    H/O seasonal allergies    Hypertension     Patient Active Problem List   Diagnosis Date Noted   Diabetes mellitus without complication ([place]) 123XX123   History of 2019 novel coronavirus disease (COVID-19) 09/29/2019   Anxiety and depression 02/07/2018   Allergic rhinitis 03/01/2017   Seasonal allergic rhinitis due to pollen 03/01/2017   Skin lesion of face 09/05/2015   Upper airway cough syndrome 06/04/2015   Solitary pulmonary nodule 06/04/2015   Hematuria 05/20/2015   Abdominal pain, epigastric 11/01/2013   OSA (obstructive sleep apnea) 03/30/2013   Routine general medical examination at a health care facility 12/27/2012   Essential hypertension 12/20/2012   Lipoma of back 12/20/2012   Meralgia paresthetica of left side 12/20/2012   Morbid obesity ([place]) 12/20/2012    Past Surgical History:  Procedure Laterality Date   FRACTURE SURGERY  1965   skull       Family History  Problem Relation Age of Onset   Arthritis Maternal Grandmother    Arthritis Maternal Grandfather    Heart disease Maternal Grandfather        MI   Arthritis Paternal Grandmother     Arthritis Paternal Grandfather    Colon cancer Paternal Grandfather        died with colon cancer at age 46   Sleep apnea Mother    Stroke Mother    Hypertension Mother    Heart disease Father        multiple stent placements.   COPD Father        smoked   Hypertension Father    AAA (abdominal aortic aneurysm) Father    Cancer Father        ureter   Heart disease Cousin        MI   Mental illness Son 9       anxiety    Social History   Tobacco Use   Smoking status: Never   Smokeless tobacco: Never  Vaping Use   Vaping Use: Never used  Substance Use Topics   Alcohol use: No   Drug use: No    Home Medications Prior to Admission medications   Medication Sig Start Date End Date Taking? Authorizing Provider  ondansetron (ZOFRAN ODT) 4 MG disintegrating tablet Take 1 tablet (4 mg total) by mouth every 8 (eight) hours as needed for nausea or vomiting. 06/07/21  Yes Margarita Mail, PA-C  amLODipine (NORVASC) 10 MG tablet Take 1 tablet by mouth once daily 05/29/21   Midge Minium, MD  amoxicillin-clavulanate (AUGMENTIN) 875-125 MG tablet Take 1 tablet by mouth 2 (  two) times daily. Patient not taking: Reported on 05/16/2021 01/05/21   [provider]  aspirin EC 81 MG tablet Take 81 mg by mouth as needed. Patient not taking: No sig reported    [provider]  Aspirin-Acetaminophen-Caffeine (GOODY HEADACHE PO) Take 1 packet by mouth once as needed. For pain or headache    [provider]  cetirizine (ZYRTEC) 10 MG tablet Take 10 mg by mouth daily. Patient not taking: No sig reported    [provider]  furosemide (LASIX) 20 MG tablet Take 1 tablet by mouth once daily 05/29/21   Midge Minium, MD  glucose blood (ONETOUCH VERIO) test strip Use as instructed to test sugars 1-2 times daily. Dx. E11.9 05/24/20   Midge Minium, MD  Lancets [facility] ULTRASOFT) lancets Use as instructed to test sugars 1-2 times daily. Dx E11.9 05/24/20    Midge Minium, MD  losartan (COZAAR) 100 MG tablet Take 1 tablet by mouth once daily 05/29/21   Midge Minium, MD  sertraline (ZOLOFT) 25 MG tablet Take 1 tablet by mouth once daily 05/29/21   Midge Minium, MD  Triamcinolone Acetonide (NASACORT ALLERGY 24HR NA) Place into the nose.    [provider]  VENTOLIN HFA 108 (90 Base) MCG/ACT inhaler INHALE 2 PUFFS BY MOUTH EVERY 6 HOURS AS NEEDED FOR WHEEZING FOR SHORTNESS OF BREATH 05/29/21   Midge Minium, MD    Allergies    Enalapril maleate, Codeine, Hydrochlorothiazide, and Sulfa antibiotics  Review of Systems   Review of Systems  Ten systems reviewed and are negative for acute change, except as noted in the HPI.   Physical Exam Updated Vital Signs BP 125/69   Pulse (!) 57   Temp 97.9 F (36.6 C) (Oral)   Resp 18   Ht '5\' 3"'$  (1.6 m)   Wt 117 kg   SpO2 97%   BMI 45.70 kg/m   Physical Exam Vitals and nursing note reviewed.  Constitutional:      General: He is not in acute distress.    Appearance: He is well-developed. He is not diaphoretic.  HENT:     Head: Normocephalic and atraumatic.  Eyes:     General: No scleral icterus.    Conjunctiva/sclera: Conjunctivae normal.  Cardiovascular:     Rate and Rhythm: Normal rate and regular rhythm.     Heart sounds: Normal heart sounds.  Pulmonary:     Effort: Pulmonary effort is normal. No respiratory distress.     Breath sounds: Normal breath sounds.  Abdominal:     General: There is no distension.     Palpations: Abdomen is soft. There is no mass.     Tenderness: There is no abdominal tenderness. There is no guarding.  Musculoskeletal:     Cervical back: Normal range of motion and neck supple.  Skin:    General: Skin is warm and dry.  Neurological:     Mental Status: He is alert.  Psychiatric:        Behavior: Behavior normal.    ED Results / Procedures / Treatments   Labs (all labs ordered are listed, but only abnormal results are  displayed) Labs Reviewed  HEPATIC FUNCTION PANEL - Abnormal; Notable for the following components:      Result Value   Indirect Bilirubin 0.2 (*)    All other components within normal limits  CBC WITH DIFFERENTIAL/PLATELET  BASIC METABOLIC PANEL  LIPASE, BLOOD  CK  TROPONIN I (HIGH SENSITIVITY)  TROPONIN I (HIGH SENSITIVITY)    EKG EKG Interpretation  Date/Time:  Saturday June 07 2021 18:14:57 EDT Ventricular Rate:  61 PR Interval:  178 QRS Duration: 100 QT Interval:  409 QTC Calculation: 412 R Axis:   96 Text Interpretation: Sinus rhythm Right axis deviation No acute changes No old tracing to compare Confirmed by Varney Biles [phone]) on 06/08/2021 1:13:23 PM  Radiology US Abdomen Limited RUQ (LIVER/GB)  Result Date: 06/07/2021 CLINICAL DATA:  Nausea for 2 weeks. EXAM: ULTRASOUND ABDOMEN LIMITED RIGHT UPPER QUADRANT COMPARISON:  CT scan April 26, 2018 FINDINGS: Gallbladder: There is a 5 mm polyp in the gallbladder. No wall thickening, stone, or Murphy's sign. Mild sludge is identified. Common bile duct: Diameter: 2 mm Liver: There is increased echogenicity throughout the liver. There is focal fatty sparing adjacent to the gallbladder fossa. There is a 2.0 x 1.5 x 1.3 cm hypoechoic mass in the right hepatic lobe without internal blood flow. Portal vein is patent on color Doppler imaging with normal direction of blood flow towards the liver. Other: 3 cysts are seen in the right kidney measuring 2.7, 4.0, and 3.2 cm. IMPRESSION: 1. 5 mm polyp in the gallbladder. This does not require follow-up. There may be minimal sludge in the gallbladder. The gallbladder is otherwise normal. 2. Hepatic steatosis with focal fatty sparing adjacent to the gallbladder fossa. 3. 2.0 x 1.5 x 1.3 cm rounded hypoechoic mass in the right hepatic lobe, nonspecific. If the patient gets a CT scan this evening, recommend attention to this region. Otherwise, recommend an MRI for further assessment as an outpatient.  4. Renal cysts. Electronically Signed   By: Dorise Bullion III M.D   On: 06/07/2021 20:44    Procedures Procedures   Medications Ordered in ED Medications  acetaminophen (TYLENOL) tablet 650 mg (650 mg Oral Given 06/07/21 1937)    ED Course  I have reviewed the triage vital signs and the nursing notes.  Pertinent labs & imaging results that were available during my care of the patient were reviewed by me and considered in my medical decision making (see chart for details).    MDM Rules/Calculators/A&P                           Patient here with complaint of nausea ongoing for the past several days, headaches, fatigue and left upper quadrant abdominal pain radiating around the left side.  He has a benign abdominal exam without any tenderness.  No evidence of rashes.  I have no suspicion for unerupted shingles given his symptoms.  He has no pain at this time and no nausea.The differential diagnosis for generalized abdominal pain includes, but is not limited to AAA, gastroenteritis, appendicitis, Bowel obstruction, Bowel perforation. Gastroparesis, DKA, Hernia, Inflammatory bowel disease, mesenteric ischemia, pancreatitis, peritonitis SBP, volvulus. I ordered and reviewed labs that included a CBC, BMP, troponin, lipase, CK to rule out myositis given his intermittent thigh spasms and achiness along with a hepatic function panel all without any significant abnormality.  I also ordered an ultrasound of the right upper quadrant to rule out any issues with the biliary tree.  It does appear to be an abnormal finding requiring outpatient MRI.  I discussed this with the patient.  He does not wish to pursue further imaging tonight and will follow closely with his primary care physician.  I ordered and reviewed an EKG which shows normal sinus rhythm at a rate of 61 without ischemic  changes.  Overall I do not find any emergent cause of the patient's nausea and abdominal discomfort.  He is under significant  stress with his work.  There does not appear to be any evidence of pancreatitis, diverticulitis, ACS.  Patient has maintained normal vital signs with reassuring labs and benign abdominal exam.  Patient has close outpatient follow-up and at this time appears appropriate for discharge.  All questions answered to the best of my ability, return precautions discussed will be discharged with Zofran. Final Clinical Impression(s) / ED Diagnoses Final diagnoses:  Nausea  Liver lesion    Rx / DC Orders ED Discharge Orders          Ordered    ondansetron (ZOFRAN ODT) 4 MG disintegrating tablet  Every 8 hours PRN        06/07/21 2155             Margarita Mail, PA-C 06/09/21 1048    Malvin Johns, MD 06/12/21 [phone]

## 2021-08-15 ENCOUNTER — Encounter: Payer: Self-pay | Admitting: Family Medicine

## 2021-08-18 ENCOUNTER — Encounter: Payer: Self-pay | Admitting: Family Medicine

## 2021-08-22 ENCOUNTER — Encounter: Payer: Self-pay | Admitting: Registered Nurse

## 2021-08-22 ENCOUNTER — Ambulatory Visit (INDEPENDENT_AMBULATORY_CARE_PROVIDER_SITE_OTHER): Payer: Commercial Managed Care - PPO | Admitting: Registered Nurse

## 2021-08-22 ENCOUNTER — Other Ambulatory Visit: Payer: Self-pay | Admitting: Family Medicine

## 2021-08-22 ENCOUNTER — Other Ambulatory Visit: Payer: Self-pay

## 2021-08-22 VITALS — BP 160/82 | HR 70 | Temp 98.3°F | Resp 18 | Ht 63.0 in | Wt 253.6 lb

## 2021-08-22 DIAGNOSIS — Z23 Encounter for immunization: Secondary | ICD-10-CM | POA: Diagnosis not present

## 2021-08-22 DIAGNOSIS — I10 Essential (primary) hypertension: Secondary | ICD-10-CM

## 2021-08-22 DIAGNOSIS — Z566 Other physical and mental strain related to work: Secondary | ICD-10-CM

## 2021-08-22 MED ORDER — ATENOLOL 25 MG PO TABS: 25.0000 mg | ORAL_TABLET | Freq: Every day | ORAL | 3 refills | Status: DC

## 2021-08-22 MED ORDER — SERTRALINE HCL 50 MG PO TABS: 50.0000 mg | ORAL_TABLET | Freq: Every day | ORAL | 3 refills | Status: DC

## 2021-08-22 NOTE — Patient Instructions (Signed)
Mr. Langille -  Doristine Devoid to meet you  Increase sertraline to 50mg  daily - ok to split these and stay at 25. May take a couple weeks to show effect.  Start atenolol 25mg  daily. Check home BP if able. Come back in around 4-6 weeks for bp check and check on sertraline with Dr. Darene Lamer   Thank you  Denice Paradise

## 2021-08-22 NOTE — Progress Notes (Signed)
Established Patient Office Visit  Subjective:  Patient ID: Jacob Crawford, male    DOB: 1956/02/16  Age: 65 y.o. MRN: [id number]  CC:  Chief Complaint  Patient presents with   Hypertension    Patient states he went to a urgent car and blood pressure was elevated.    HPI Jacob Crawford presents for htn  Hypertension: Patient Currently taking: amlodipine 10mg  po qd, furosemide 20mg  po qd, losartan 100mg  po qd Good effect. No AEs. Denies CV symptoms including: chest pain, shob, doe, headache, visual changes, fatigue, claudication, and dependent edema.   Previous readings and labs: BP Readings from Last 3 Encounters:  08/22/21 (!) 160/82  06/07/21 125/69  01/17/21 128/78   Lab Results  Component Value Date   CREATININE 0.77 06/07/2021   Work stress Ongoing On sertraline 25mg  daily Thinks stress is affecting his bp Notes hx of labile mood   Flu vaccine Needed today No AE in past.   Past Medical History:  Diagnosis Date   Diabetes mellitus without complication ([place])    H/O seasonal allergies    Hypertension     Past Surgical History:  Procedure Laterality Date   FRACTURE SURGERY  1965   skull    Family History  Problem Relation Age of Onset   Arthritis Maternal Grandmother    Arthritis Maternal Grandfather    Heart disease Maternal Grandfather        MI   Arthritis Paternal Grandmother    Arthritis Paternal Grandfather    Colon cancer Paternal Grandfather        died with colon cancer at age 41   Sleep apnea Mother    Stroke Mother    Hypertension Mother    Heart disease Father        multiple stent placements.   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     Employer: [employer]    Comment: ended in march 2018   Occupation: Superintendant, Chiropractor  Tobacco Use   Smoking status: Never   Smokeless tobacco: Never  Vaping Use   Vaping Use: Never used  Substance and Sexual Activity   Alcohol use: No   Drug use: No   Sexual activity: Not on file  Other Topics Concern   Not on file  Social History Narrative   Not on file   Social Determinants of Health   Financial Resource Strain: Not on file  Food Insecurity: Not on file  Transportation Needs: Not on file  Physical Activity: Not on file  Stress: Not on file  Social Connections: Not on file  Intimate Partner Violence: Not on file    Outpatient Medications Prior to Visit  Medication Sig Dispense Refill   amLODipine (NORVASC) 10 MG tablet Take 1 tablet by mouth once daily 90 tablet 0   Aspirin-Acetaminophen-Caffeine (GOODY HEADACHE PO) Take 1 packet  by mouth once as needed. For pain or headache     furosemide (LASIX) 20 MG tablet Take 1 tablet by mouth once daily 90 tablet 0   glucose blood (ONETOUCH VERIO) test strip Use as instructed to test sugars 1-2 times daily. Dx. E11.9 100 each 12   Lancets (ONETOUCH ULTRASOFT) lancets Use as instructed to test sugars 1-2 times daily. Dx E11.9 100 each 12   losartan (COZAAR) 100 MG tablet Take 1 tablet by mouth once daily 90 tablet 0   ondansetron (ZOFRAN ODT) 4 MG disintegrating tablet Take 1 tablet (4 mg total) by mouth every 8 (eight) hours as needed for nausea or vomiting. 10 tablet 0   Triamcinolone Acetonide (NASACORT ALLERGY 24HR NA) Place into the nose.     VENTOLIN HFA 108 (90 Base) MCG/ACT inhaler INHALE 2 PUFFS BY MOUTH EVERY 6 HOURS AS NEEDED FOR WHEEZING FOR SHORTNESS OF BREATH 18 g 0   sertraline (ZOLOFT) 25 MG tablet Take 1 tablet by mouth once daily 90 tablet 0   amoxicillin-clavulanate  (AUGMENTIN) 875-125 MG tablet Take 1 tablet by mouth 2 (two) times daily. (Patient not taking: Reported on 05/16/2021)     aspirin EC 81 MG tablet Take 81 mg by mouth as needed. (Patient not taking: No sig reported)     cetirizine (ZYRTEC) 10 MG tablet Take 10 mg by mouth daily. (Patient not taking: No sig reported)     No facility-administered medications prior to visit.    Allergies  Allergen Reactions   Enalapril Maleate Cough   Codeine Nausea And Vomiting   Hydrochlorothiazide Nausea Only   Sulfa Antibiotics Rash    Ace cough    ROS Review of Systems  Constitutional: Negative.   HENT: Negative.    Eyes: Negative.   Respiratory: Negative.    Cardiovascular: Negative.   Gastrointestinal: Negative.   Genitourinary: Negative.   Musculoskeletal: Negative.   Skin: Negative.   Neurological: Negative.   Psychiatric/Behavioral: Negative.    All other systems reviewed and are negative.    Objective:    Physical Exam Constitutional:      General: He is not in acute distress.    Appearance: Normal appearance. He is normal weight. He is not ill-appearing, toxic-appearing or diaphoretic.  Cardiovascular:     Rate and Rhythm: Normal rate and regular rhythm.     Heart sounds: Normal heart sounds. No murmur heard.   No friction rub. No gallop.  Pulmonary:     Effort: Pulmonary effort is normal. No respiratory distress.     Breath sounds: Normal breath sounds. No stridor. No wheezing, rhonchi or rales.  Chest:     Chest wall: No tenderness.  Neurological:     General: No focal deficit present.     Mental Status: He is alert and oriented to person, place, and time. Mental status is at baseline.  Psychiatric:        Mood and Affect: Mood normal.        Behavior: Behavior normal.        Thought Content: Thought content normal.        Judgment: Judgment normal.    BP (!) 160/82   Pulse 70   Temp 98.3 F (36.8 C) (Temporal)   Resp 18   Ht 5\' 3"  (1.6 m)   Wt 253 lb 9.6 oz  (115 kg)   SpO2 99%   BMI 44.92 kg/m  Wt Readings from Last 3 Encounters:  08/22/21 253 lb 9.6 oz (115 kg)  06/07/21 258 lb (117 kg)  01/17/21 255 lb 12.8 oz (116 kg)     Health Maintenance Due  Topic Date Due   HIV Screening  Never done   COVID-19 Vaccine (4 - Booster) 03/15/2021   FOOT EXAM  06/07/2021    There are no preventive care reminders to display for this patient.  Lab Results  Component Value Date   TSH 3.03 05/30/2021   Lab Results  Component Value Date   WBC 6.6 06/07/2021   HGB 16.3 06/07/2021   HCT 47.1 06/07/2021   MCV 89.0 06/07/2021   PLT 206 06/07/2021   Lab Results  Component Value Date   NA 140 06/07/2021   K 3.6 06/07/2021   CO2 25 06/07/2021   GLUCOSE 88 06/07/2021   BUN 15 06/07/2021   CREATININE 0.77 06/07/2021   BILITOT 0.4 06/07/2021   ALKPHOS 68 06/07/2021   AST 26 06/07/2021   ALT 44 06/07/2021   PROT 7.0 06/07/2021   ALBUMIN 4.1 06/07/2021   CALCIUM 9.2 06/07/2021   ANIONGAP 10 06/07/2021   GFR 93.55 09/16/2020   Lab Results  Component Value Date   CHOL 151 05/30/2021   Lab Results  Component Value Date   HDL 41 05/30/2021   Lab Results  Component Value Date   LDLCALC 87 05/30/2021   Lab Results  Component Value Date   TRIG 131 05/30/2021   Lab Results  Component Value Date   CHOLHDL 3.7 05/30/2021   Lab Results  Component Value Date   HGBA1C 5.8 (H) 05/30/2021      Assessment & Plan:   Problem List Items Addressed This Visit       Cardiovascular and Mediastinum   Essential hypertension - Primary   Relevant Medications   atenolol (TENORMIN) 25 MG tablet   Other Visit Diagnoses     Flu vaccine need       Relevant Orders   Flu Vaccine QUAD 6+ mos PF IM (Fluarix Quad PF) (Completed)   Uncontrolled hypertension       Relevant Medications   atenolol (TENORMIN) 25 MG tablet   Work stress       Relevant Medications   sertraline (ZOLOFT) 50 MG tablet       Meds ordered this encounter   Medications   atenolol (TENORMIN) 25 MG tablet    Sig: Take 1 tablet (25 mg total) by mouth daily.    Dispense:  90 tablet    Refill:  3    Order Specific Question:   Supervising Provider    Answer:   Carlota Raspberry, JEFFREY R [[id number]]   sertraline (ZOLOFT) 50 MG tablet    Sig: Take 1 tablet (50 mg total) by mouth daily.    Dispense:  30 tablet    Refill:  3    Order Specific Question:   Supervising Provider    Answer:   Carlota Raspberry, JEFFREY R [[id number]]    Follow-up: Return in about 4 weeks (around 09/19/2021) for reschedule upcoming visit with Dr. Birdie Riddle.   PLAN Increase sertraline to 50mg  po qd Start atenolol 25mg  po qd Return for med check in 4-6 weeks Flu shot given Patient encouraged to call clinic with any questions, comments, or concerns.  Maximiano Coss, NP

## 2021-09-19 ENCOUNTER — Other Ambulatory Visit: Payer: Self-pay

## 2021-09-19 ENCOUNTER — Ambulatory Visit (INDEPENDENT_AMBULATORY_CARE_PROVIDER_SITE_OTHER): Payer: Commercial Managed Care - PPO | Admitting: Registered Nurse

## 2021-09-19 ENCOUNTER — Encounter: Payer: Self-pay | Admitting: Registered Nurse

## 2021-09-19 VITALS — BP 140/80 | HR 60 | Temp 98.0°F | Resp 18 | Ht 63.0 in | Wt 254.0 lb

## 2021-09-19 DIAGNOSIS — I10 Essential (primary) hypertension: Secondary | ICD-10-CM | POA: Diagnosis not present

## 2021-09-19 DIAGNOSIS — F419 Anxiety disorder, unspecified: Secondary | ICD-10-CM | POA: Diagnosis not present

## 2021-09-19 DIAGNOSIS — J029 Acute pharyngitis, unspecified: Secondary | ICD-10-CM | POA: Diagnosis not present

## 2021-09-19 DIAGNOSIS — F32A Depression, unspecified: Secondary | ICD-10-CM

## 2021-09-19 MED ORDER — BUPROPION HCL ER (SR) 150 MG PO TB12: ORAL_TABLET | ORAL | 0 refills | Status: DC

## 2021-09-19 NOTE — Patient Instructions (Addendum)
Jacob Crawford -   Doristine Devoid to see you  Cut sertraline in half. Take half tab for one week, then stop  Start wellbutrin 150mg  SR in mornings. After 3-4 weeks, increase to twice daily.   Med check with Dr. Birdie Riddle in 8-10 weeks.   Trazodone half tab for sleep - make sure you take about 45 minutes before bed and leave yourself 6-8 hours to sleep  I have attached patient info form on trazodone.  Call me if you need anything  Happy Holidays,   Denice Paradise     If you have lab work done today you will be contacted with your lab results within the next 2 weeks.  If you have not heard from [place] then please contact us. The fastest way to get your results is to register for My Chart.   IF you received an x-ray today, you will receive an invoice from [facility]. Please contact [facility] at [phone] with questions or concerns regarding your invoice.   IF you received labwork today, you will receive an invoice from [facility]. Please contact [facility] at [phone] with questions or concerns regarding your invoice.   Our billing staff will not be able to assist you with questions regarding bills from these companies.  You will be contacted with the lab results as soon as they are available. The fastest way to get your results is to activate your My Chart account. Instructions are located on the last page of this paperwork. If you have not heard from [place] regarding the results in 2 weeks, please contact this office.

## 2021-09-19 NOTE — Progress Notes (Addendum)
Established Patient Office Visit  Subjective:  Patient ID: Jacob Crawford, male    DOB: 02/05/1956  Age: 65 y.o. MRN: [id number]  CC:  Chief Complaint  Patient presents with   Follow-up    Patient states he is here for his follow up on hypertension. Patient states he also has a sore throat started yesterday.    HPI ARLIS YALE presents for HTN and sore throat  Hypertension: Patient Currently taking: atenolol 25mg  po qd, amlodipine 10mg  po qd, furosemide 20mg  po qd, losartan 100mg  po qd.  Good effect. No AEs. Denies CV symptoms including: chest pain, shob, doe, headache, visual changes, fatigue, claudication, and dependent edema.   Previous readings and labs: BP Readings from Last 3 Encounters:  09/19/21 140/80  08/22/21 (!) 160/82  06/07/21 125/69   Lab Results  Component Value Date   CREATININE 0.77 56/43/3295   Systolic elevated today, getting home readings usually low to mid 130s.  He has been feeling sick lately - thinks this may be contributing to elevation in office today.   Sore throat Onset yesterday Pain with swallowing No sick contacts No other symptoms Would like strep test  Depression Not getting good therapeutic effect from zoloft Decreased libido as AE Wants to switch to wellbutrin No hi/si  Past Medical History:  Diagnosis Date   Diabetes mellitus without complication ([place])    H/O seasonal allergies    Hypertension     Past Surgical History:  Procedure Laterality Date   FRACTURE SURGERY  1965   skull    Family History  Problem Relation Age of Onset   Arthritis Maternal Grandmother    Arthritis Maternal Grandfather    Heart disease Maternal Grandfather        MI   Arthritis Paternal Grandmother    Arthritis Paternal Grandfather    Colon cancer Paternal Grandfather        died with colon cancer at age 32   Sleep apnea Mother    Stroke Mother    Hypertension Mother    Heart disease Father        multiple stent placements.    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     Employer: [employer]    Comment: ended in march 2018   Occupation: Superintendant, commercial  Tobacco Use   Smoking status: Never   Smokeless tobacco: Never  Vaping Use   Vaping Use: Never used  Substance and Sexual Activity   Alcohol use: No   Drug use: No   Sexual activity: Not on file  Other Topics Concern   Not on file  Social History Narrative   Not on file   Social Determinants of Health   Financial Resource Strain: Not on file  Food Insecurity: Not on file  Transportation Needs: Not on file  Physical Activity: Not on file  Stress:  Not on file  Social Connections: Not on file  Intimate Partner Violence: Not on file    Outpatient Medications Prior to Visit  Medication Sig Dispense Refill   amLODipine (NORVASC) 10 MG tablet Take 1 tablet by mouth once daily 90 tablet 0   Aspirin-Acetaminophen-Caffeine (GOODY HEADACHE PO) Take 1 packet by mouth once as needed. For pain or headache     atenolol (TENORMIN) 25 MG tablet Take 1 tablet (25 mg total) by mouth daily. 90 tablet 3   furosemide (LASIX) 20 MG tablet Take 1 tablet by mouth once daily 90 tablet 0   glucose blood (ONETOUCH VERIO) test strip Use as instructed to test sugars 1-2 times daily. Dx. E11.9 100 each 12   Lancets (ONETOUCH ULTRASOFT) lancets Use as instructed to test sugars 1-2 times daily. Dx E11.9 100 each 12   losartan (COZAAR) 100 MG tablet Take 1 tablet by mouth once daily 90 tablet 0   ondansetron (ZOFRAN ODT) 4 MG disintegrating tablet  Take 1 tablet (4 mg total) by mouth every 8 (eight) hours as needed for nausea or vomiting. 10 tablet 0   sertraline (ZOLOFT) 50 MG tablet Take 1 tablet (50 mg total) by mouth daily. 30 tablet 3   Triamcinolone Acetonide (NASACORT ALLERGY 24HR NA) Place into the nose.     VENTOLIN HFA 108 (90 Base) MCG/ACT inhaler INHALE 2 PUFFS BY MOUTH EVERY 6 HOURS AS NEEDED FOR WHEEZING FOR SHORTNESS OF BREATH 18 g 0   No facility-administered medications prior to visit.    Allergies  Allergen Reactions   Enalapril Maleate Cough   Codeine Nausea And Vomiting   Hydrochlorothiazide Nausea Only   Sulfa Antibiotics Rash    Ace cough    ROS Review of Systems  Constitutional: Negative.   HENT:  Positive for sore throat. Negative for congestion, ear pain, rhinorrhea, sinus pressure, sinus pain and trouble swallowing.   Eyes: Negative.   Respiratory: Negative.    Cardiovascular: Negative.   Gastrointestinal: Negative.   Genitourinary: Negative.   Musculoskeletal: Negative.   Skin: Negative.   Neurological: Negative.   Psychiatric/Behavioral: Negative.    All other systems reviewed and are negative.    Objective:    Physical Exam Constitutional:      General: He is not in acute distress.    Appearance: Normal appearance. He is normal weight. He is not ill-appearing, toxic-appearing or diaphoretic.  HENT:     Mouth/Throat:     Mouth: Mucous membranes are moist.     Pharynx: Oropharynx is clear. No oropharyngeal exudate or posterior oropharyngeal erythema.  Cardiovascular:     Rate and Rhythm: Normal rate and regular rhythm.     Heart sounds: Normal heart sounds. No murmur heard.   No friction rub. No gallop.  Pulmonary:     Effort: Pulmonary effort is normal. No respiratory distress.     Breath sounds: Normal breath sounds. No stridor. No wheezing, rhonchi or rales.  Chest:     Chest wall: No tenderness.  Lymphadenopathy:     Cervical: No cervical adenopathy.  Neurological:      General: No focal deficit present.     Mental Status: He is alert and oriented to person, place, and time. Mental status is at baseline.  Psychiatric:        Mood and Affect: Mood normal.        Behavior: Behavior normal.        Thought Content: Thought content normal.  Judgment: Judgment normal.    BP 140/80   Pulse 60   Temp 98 F (36.7 C) (Temporal)   Resp 18   Ht 5\' 3"  (1.6 m)   Wt 254 lb (115.2 kg)   SpO2 99%   BMI 44.99 kg/m  Wt Readings from Last 3 Encounters:  09/19/21 254 lb (115.2 kg)  08/22/21 253 lb 9.6 oz (115 kg)  06/07/21 258 lb (117 kg)     Health Maintenance Due  Topic Date Due   Pneumococcal Vaccine 83-38 Years old (1 - PCV) Never done   HIV Screening  Never done   COLONOSCOPY (Pts 45-19yrs Insurance coverage will need to be confirmed)  02/17/2018   COVID-19 Vaccine (4 - Booster) 01/10/2021   OPHTHALMOLOGY EXAM  05/21/2021   FOOT EXAM  06/07/2021    There are no preventive care reminders to display for this patient.  Lab Results  Component Value Date   TSH 3.03 05/30/2021   Lab Results  Component Value Date   WBC 6.6 06/07/2021   HGB 16.3 06/07/2021   HCT 47.1 06/07/2021   MCV 89.0 06/07/2021   PLT 206 06/07/2021   Lab Results  Component Value Date   NA 140 06/07/2021   K 3.6 06/07/2021   CO2 25 06/07/2021   GLUCOSE 88 06/07/2021   BUN 15 06/07/2021   CREATININE 0.77 06/07/2021   BILITOT 0.4 06/07/2021   ALKPHOS 68 06/07/2021   AST 26 06/07/2021   ALT 44 06/07/2021   PROT 7.0 06/07/2021   ALBUMIN 4.1 06/07/2021   CALCIUM 9.2 06/07/2021   ANIONGAP 10 06/07/2021   GFR 93.55 09/16/2020   Lab Results  Component Value Date   CHOL 151 05/30/2021   Lab Results  Component Value Date   HDL 41 05/30/2021   Lab Results  Component Value Date   LDLCALC 87 05/30/2021   Lab Results  Component Value Date   TRIG 131 05/30/2021   Lab Results  Component Value Date   CHOLHDL 3.7 05/30/2021   Lab Results  Component Value Date    HGBA1C 5.8 (H) 05/30/2021      Assessment & Plan:   Problem List Items Addressed This Visit       Cardiovascular and Mediastinum   Essential hypertension     Other   Anxiety and depression - Primary   Relevant Medications   buPROPion (WELLBUTRIN SR) 150 MG 12 hr tablet   Other Visit Diagnoses     Sore throat           Meds ordered this encounter  Medications   buPROPion (WELLBUTRIN SR) 150 MG 12 hr tablet    Sig: Take 1 tablet (150 mg total) by mouth 2 (two) times daily for 28 days, THEN 1 tablet (150 mg total) 2 (two) times daily.    Dispense:  180 tablet    Refill:  0    Order Specific Question:   Supervising Provider    Answer:   Carlota Raspberry, JEFFREY R [[id number]]    Follow-up: Return in about 9 weeks (around 11/21/2021) for med check - pcp.   PLAN BP improved, still above goal, but given good home readings and current illness, permissive htn at this time. Recommend aggressive lifestyle management Return in 8-10 weeks for med check Start wellbutrin 150mg  SR po qd, increase to bid after 3-4 weeks Half sertraline for one week then stop Trazodone 25-50mg  po qhs prn for sleep Strep test negative. Exam reassuring. Likely viral illness, discussed supportive care. Call  Monday if worsening. Pt agrees to plan.  Discussed plan including risks, benefits, alternatives. Pt voices understanding. Patient encouraged to call clinic with any questions, comments, or concerns.  Maximiano Coss, NP

## 2021-09-26 ENCOUNTER — Ambulatory Visit: Payer: Commercial Managed Care - PPO | Admitting: Family Medicine

## 2021-10-20 ENCOUNTER — Encounter: Payer: Self-pay | Admitting: Family Medicine

## 2021-10-21 MED ORDER — VALSARTAN 320 MG PO TABS: 320.0000 mg | ORAL_TABLET | Freq: Every day | ORAL | 3 refills | Status: DC

## 2021-11-05 ENCOUNTER — Ambulatory Visit: Payer: Commercial Managed Care - PPO | Admitting: Family Medicine

## 2021-11-05 ENCOUNTER — Encounter: Payer: Self-pay | Admitting: *Deleted

## 2021-11-07 ENCOUNTER — Ambulatory Visit (INDEPENDENT_AMBULATORY_CARE_PROVIDER_SITE_OTHER): Payer: Commercial Managed Care - PPO | Admitting: Family Medicine

## 2021-11-07 ENCOUNTER — Encounter: Payer: Self-pay | Admitting: Family Medicine

## 2021-11-07 VITALS — BP 120/82 | HR 88 | Temp 98.8°F | Ht 62.99 in | Wt 239.6 lb

## 2021-11-07 DIAGNOSIS — F419 Anxiety disorder, unspecified: Secondary | ICD-10-CM | POA: Diagnosis not present

## 2021-11-07 DIAGNOSIS — I10 Essential (primary) hypertension: Secondary | ICD-10-CM

## 2021-11-07 DIAGNOSIS — E119 Type 2 diabetes mellitus without complications: Secondary | ICD-10-CM | POA: Diagnosis not present

## 2021-11-07 DIAGNOSIS — N529 Male erectile dysfunction, unspecified: Secondary | ICD-10-CM

## 2021-11-07 DIAGNOSIS — F32A Depression, unspecified: Secondary | ICD-10-CM

## 2021-11-07 MED ORDER — BUPROPION HCL ER (XL) 150 MG PO TB24: 150.0000 mg | ORAL_TABLET | Freq: Every day | ORAL | 1 refills | Status: DC

## 2021-11-07 MED ORDER — SILDENAFIL CITRATE 100 MG PO TABS: 50.0000 mg | ORAL_TABLET | Freq: Every day | ORAL | 11 refills | Status: DC | PRN

## 2021-11-07 NOTE — Progress Notes (Signed)
° °  Subjective:    Patient ID: Jacob Crawford, male    DOB: May 17, 1956, 65 y.o.   MRN: [id number]  HPI HTN- pt had sexual side effects from Metoprolol so we stopped this and switched the Losartan to the more potent Valsartan.  BP today is excellent.  DM- pt is down 16 lbs.  Has been able to control w/ diet.  On ARB for renal protection.  Due for foot exam.  Due for eye exam.  Obesity- pt is down 16 lbs since March.  [facility]  Depression- pt's sister is an alcoholic and moved in w/ his mother.  Due to all the stress mom attempted suicide w/ Xanax OD.  EMS/police have been called to the house 'over 20 times'.  Pt reports all the stress has taken a toll on his sexual relationship with his wife.  Having a difficult w/ both libido and erections.  Pt feels he needs to talk to someone.  Currently on Wellbutrin   Review of Systems For ROS see HPI   This visit occurred during the SARS-CoV-2 public health emergency.  Safety protocols were in place, including screening questions prior to the visit, additional usage of staff PPE, and extensive cleaning of exam room while observing appropriate contact time as indicated for disinfecting solutions.      Objective:   Physical Exam Vitals reviewed.  Constitutional:      General: He is not in acute distress.    Appearance: Normal appearance. He is obese. He is not ill-appearing.  HENT:     Head: Normocephalic and atraumatic.  Eyes:     Extraocular Movements: Extraocular movements intact.     Conjunctiva/sclera: Conjunctivae normal.     Pupils: Pupils are equal, round, and reactive to light.  Cardiovascular:     Rate and Rhythm: Normal rate and regular rhythm.     Pulses: Normal pulses.  Pulmonary:     Effort: Pulmonary effort is normal. No respiratory distress.     Breath sounds: Normal breath sounds. No wheezing.  Musculoskeletal:     Right lower leg: No edema.     Left lower leg: No edema.  Skin:    General: Skin is warm and  dry.  Neurological:     General: No focal deficit present.     Mental Status: He is alert and oriented to person, place, and time.  Psychiatric:        Mood and Affect: Mood normal.        Behavior: Behavior normal.        Thought Content: Thought content normal.          Assessment & Plan:

## 2021-11-07 NOTE — Patient Instructions (Addendum)
Move your physical to early Feb We'll notify you of your lab results and make any changes if needed Keep up the good work on healthy diet and regular exercise- you're doing great!!! Blood pressure looks great!  No changes at this time! Schedule your eye exam and have them send me a copy START the Viagra as needed.  1-24 hrs prior to sexual activity CHANGE to the extended release Wellbutrin once daily We'll call you with your counseling appt Call with any questions or concerns Hang in there!!! Happy New Year!!!

## 2021-11-08 LAB — CBC WITH DIFFERENTIAL/PLATELET
Absolute Monocytes: 442 cells/uL (ref 200–950)
Basophils Absolute: 39 cells/uL (ref 0–200)
Basophils Relative: 0.6 %
Eosinophils Absolute: 117 cells/uL (ref 15–500)
Eosinophils Relative: 1.8 %
HCT: 47.3 % (ref 38.5–50.0)
Hemoglobin: 16.4 g/dL (ref 13.2–17.1)
Lymphs Abs: 1495 cells/uL (ref 850–3900)
MCH: 30.9 pg (ref 27.0–33.0)
MCHC: 34.7 g/dL (ref 32.0–36.0)
MCV: 89.2 fL (ref 80.0–100.0)
MPV: 9.9 fL (ref 7.5–12.5)
Monocytes Relative: 6.8 %
Neutro Abs: 4407 cells/uL (ref 1500–7800)
Neutrophils Relative %: 67.8 %
Platelets: 218 10*3/uL (ref 140–400)
RBC: 5.3 10*6/uL (ref 4.20–5.80)
RDW: 12.6 % (ref 11.0–15.0)
Total Lymphocyte: 23 %
WBC: 6.5 10*3/uL (ref 3.8–10.8)

## 2021-11-08 LAB — HEMOGLOBIN A1C
Hgb A1c MFr Bld: 5.4 %{Hb}
Mean Plasma Glucose: 108 mg/dL
eAG (mmol/L): 6 mmol/L

## 2021-11-08 LAB — HEPATIC FUNCTION PANEL
AG Ratio: 1.5 (calc) (ref 1.0–2.5)
ALT: 38 U/L (ref 9–46)
AST: 26 U/L (ref 10–35)
Albumin: 4.5 g/dL (ref 3.6–5.1)
Alkaline phosphatase (APISO): 69 U/L (ref 35–144)
Bilirubin, Direct: 0.1 mg/dL (ref 0.0–0.2)
Globulin: 3 g/dL (calc) (ref 1.9–3.7)
Indirect Bilirubin: 0.5 mg/dL (calc) (ref 0.2–1.2)
Total Bilirubin: 0.6 mg/dL (ref 0.2–1.2)
Total Protein: 7.5 g/dL (ref 6.1–8.1)

## 2021-11-08 LAB — LIPID PANEL
Cholesterol: 137 mg/dL
HDL: 42 mg/dL
LDL Cholesterol (Calc): 77 mg/dL
Non-HDL Cholesterol (Calc): 95 mg/dL
Total CHOL/HDL Ratio: 3.3 (calc)
Triglycerides: 101 mg/dL

## 2021-11-08 LAB — TSH: TSH: 1.96 mIU/L (ref 0.40–4.50)

## 2021-11-08 LAB — BASIC METABOLIC PANEL
BUN: 15 mg/dL (ref 7–25)
CO2: 25 mmol/L (ref 20–32)
Calcium: 9.6 mg/dL (ref 8.6–10.3)
Chloride: 108 mmol/L (ref 98–110)
Creat: 0.9 mg/dL (ref 0.70–1.35)
Glucose, Bld: 85 mg/dL (ref 65–99)
Potassium: 3.8 mmol/L (ref 3.5–5.3)
Sodium: 142 mmol/L (ref 135–146)

## 2021-11-08 LAB — TESTOSTERONE: Testosterone: 498 ng/dL (ref 250–827)

## 2021-11-11 ENCOUNTER — Encounter: Payer: Self-pay | Admitting: Family Medicine

## 2021-11-11 DIAGNOSIS — N529 Male erectile dysfunction, unspecified: Secondary | ICD-10-CM

## 2021-11-11 HISTORY — DX: Male erectile dysfunction, unspecified: N52.9

## 2021-11-11 NOTE — Assessment & Plan Note (Signed)
New.  Suspect this is multifactorial- stress, medication side effect, HTN, DM.  Will start Viagra prn and pt to work on stress management w/ therapist.  Pt expressed understanding and is in agreement w/ plan.

## 2021-11-11 NOTE — Assessment & Plan Note (Signed)
Pt is down 16 lbs since March and has plans to continue to lose weight.  [facility] and working on [employer].  Applauded his efforts.  Will follow.

## 2021-11-11 NOTE — Assessment & Plan Note (Signed)
Ongoing issue for pt.  Has been diet controlled and has recently lost 16 lbs.  On ARB for renal protection.  Foot exam due today.  Pt to schedule eye exam.  Check labs and adjust tx plan prn.

## 2021-11-11 NOTE — Assessment & Plan Note (Signed)
Chronic problem.  Did not tolerate Metoprolol due to sexual side effects so we switched from Losartan to Valsartan.  BP is excellent today.  Check BMP w/ med change but no anticipated med changes.

## 2021-11-11 NOTE — Assessment & Plan Note (Signed)
Deteriorated.  Pt has an incredibly stressful family situation at the moment.  Currently on Wellbutrin.  Wants to speak w/ someone about his stressors.  Referral placed.  Encouraged him to continue to voice his thoughts and concerns on the matter and to set appropriate boundaries.  Will follow.

## 2021-11-21 ENCOUNTER — Ambulatory Visit: Payer: Commercial Managed Care - PPO | Admitting: Family Medicine

## 2021-12-04 ENCOUNTER — Other Ambulatory Visit: Payer: Self-pay | Admitting: Registered Nurse

## 2021-12-15 ENCOUNTER — Ambulatory Visit (INDEPENDENT_AMBULATORY_CARE_PROVIDER_SITE_OTHER): Payer: Commercial Managed Care - PPO | Admitting: Family Medicine

## 2021-12-15 ENCOUNTER — Encounter: Payer: Self-pay | Admitting: Family Medicine

## 2021-12-15 VITALS — BP 130/74 | HR 76 | Temp 98.2°F | Resp 16 | Ht 63.0 in | Wt 240.8 lb

## 2021-12-15 DIAGNOSIS — Z0000 Encounter for general adult medical examination without abnormal findings: Secondary | ICD-10-CM | POA: Diagnosis not present

## 2021-12-15 DIAGNOSIS — E119 Type 2 diabetes mellitus without complications: Secondary | ICD-10-CM

## 2021-12-15 DIAGNOSIS — Z1211 Encounter for screening for malignant neoplasm of colon: Secondary | ICD-10-CM

## 2021-12-15 NOTE — Assessment & Plan Note (Signed)
Pt's PE WNL w/ exception of obesity.  Due for colonoscopy- referral entered.  UTD on Tdap, flu.  Check labs.  Anticipatory guidance provided.

## 2021-12-15 NOTE — Assessment & Plan Note (Signed)
BMI 42.66  Stressed need for healthy diet and regular exercise.  Check labs to risk stratify.  Will follow.

## 2021-12-15 NOTE — Patient Instructions (Signed)
Follow up in 3 months to recheck diabetes We'll notify you of your lab results and make any changes if needed Schedule your eye exam and have them send me a copy of the report We'll call you with your GI appt to schedule colonoscopy Continue to work on healthy diet and regular exercise- you can do it! Call with any questions or concerns Stay Safe!  Stay Healthy! Hang in there!!!

## 2021-12-15 NOTE — Progress Notes (Signed)
° °  Subjective:    Patient ID: Jacob Crawford, male    DOB: [birth year], 66 y.o.   MRN: [id number]  HPI CPE- overdue on colonoscopy.  Due for eye exam, foot exam.  Patient Care Team    Relationship Specialty Notifications Start End  Midge Minium, MD PCP - General Family Medicine  12/20/12   Sable Feil, MD Consulting Physician Gastroenterology  09/05/15   Tanda Rockers, MD Consulting Physician Pulmonary Disease  09/05/15   Christy Sartorius, MD Referring Physician Urology  09/05/15     Health Maintenance  Topic Date Due   HIV Screening  Never done   Zoster Vaccines- Shingrix (1 of 2) Never done   COLONOSCOPY (Pts 45-39yrs Insurance coverage will need to be confirmed)  02/17/2018   COVID-19 Vaccine (4 - Booster) 01/10/2021   OPHTHALMOLOGY EXAM  05/21/2021   FOOT EXAM  06/07/2021   HEMOGLOBIN A1C  05/08/2022   TETANUS/TDAP  01/27/2028   INFLUENZA VACCINE  Completed   Hepatitis C Screening  Completed   HPV VACCINES  Aged Out      Review of Systems Patient reports no vision/hearing changes, anorexia, fever ,adenopathy, persistant/recurrent hoarseness, swallowing issues, chest pain, palpitations, edema, persistant/recurrent cough, hemoptysis, dyspnea (rest,exertional, paroxysmal nocturnal), gastrointestinal  bleeding (melena, rectal bleeding), abdominal pain, excessive heart burn, GU symptoms (dysuria, hematuria, voiding/incontinence issues) syncope, focal weakness, memory loss, numbness & tingling, skin/hair/nail changes, depression, anxiety, abnormal bruising/bleeding, musculoskeletal symptoms/signs.   This visit occurred during the SARS-CoV-2 public health emergency.  Safety protocols were in place, including screening questions prior to the visit, additional usage of staff PPE, and extensive cleaning of exam room while observing appropriate contact time as indicated for disinfecting solutions.      Objective:   Physical Exam General Appearance:    Alert, cooperative,  no distress, appears stated age, obese  Head:    Normocephalic, without obvious abnormality, atraumatic  Eyes:    PERRL, conjunctiva/corneas clear, EOM's intact, fundi    benign, both eyes       Ears:    Normal TM's and external ear canals, both ears  Nose:   Deferred due to COVID  Throat:   Neck:   Supple, symmetrical, trachea midline, no adenopathy;       thyroid:  No enlargement/tenderness/nodules  Back:     Symmetric, no curvature, ROM normal, no CVA tenderness  Lungs:     Clear to auscultation bilaterally, respirations unlabored  Chest wall:    No tenderness or deformity  Heart:    Regular rate and rhythm, S1 and S2 normal, no murmur, rub   or gallop  Abdomen:     Soft, non-tender, bowel sounds active all four quadrants,    no masses, no organomegaly  Genitalia:    Deferred to Urology  Rectal:    Extremities:   Extremities normal, atraumatic, no cyanosis or edema  Pulses:   2+ and symmetric all extremities  Skin:   Skin color, texture, turgor normal, no rashes or lesions  Lymph nodes:   Cervical, supraclavicular, and axillary nodes normal  Neurologic:   CNII-XII intact. Normal strength, sensation and reflexes      throughout          Assessment & Plan:

## 2021-12-15 NOTE — Assessment & Plan Note (Signed)
Reminded pt to schedule eye exam.  Foot exam done today.

## 2021-12-16 ENCOUNTER — Ambulatory Visit: Payer: Commercial Managed Care - PPO | Admitting: Psychology

## 2021-12-24 ENCOUNTER — Encounter: Payer: Self-pay | Admitting: Family Medicine

## 2022-01-02 ENCOUNTER — Other Ambulatory Visit: Payer: Self-pay | Admitting: Registered Nurse

## 2022-01-12 ENCOUNTER — Encounter: Payer: Self-pay | Admitting: Family Medicine

## 2022-01-12 MED ORDER — SPIRONOLACTONE 50 MG PO TABS: 50.0000 mg | ORAL_TABLET | Freq: Every day | ORAL | 1 refills | Status: DC

## 2022-01-13 MED ORDER — HYDROCHLOROTHIAZIDE 12.5 MG PO TABS: 12.5000 mg | ORAL_TABLET | Freq: Every day | ORAL | 3 refills | Status: DC

## 2022-01-13 NOTE — Addendum Note (Signed)
Addended by: Midge Minium on: 01/13/2022 10:31 AM ? ? Modules accepted: Orders ? ?

## 2022-01-30 ENCOUNTER — Encounter: Payer: Self-pay | Admitting: Family Medicine

## 2022-01-30 ENCOUNTER — Other Ambulatory Visit: Payer: Self-pay

## 2022-01-30 ENCOUNTER — Ambulatory Visit (INDEPENDENT_AMBULATORY_CARE_PROVIDER_SITE_OTHER): Payer: Commercial Managed Care - PPO | Admitting: Family Medicine

## 2022-01-30 VITALS — BP 124/64 | HR 68 | Temp 98.0°F | Resp 18 | Ht 63.0 in | Wt 236.0 lb

## 2022-01-30 DIAGNOSIS — E119 Type 2 diabetes mellitus without complications: Secondary | ICD-10-CM | POA: Diagnosis not present

## 2022-01-30 DIAGNOSIS — I10 Essential (primary) hypertension: Secondary | ICD-10-CM

## 2022-01-30 DIAGNOSIS — F32A Depression, unspecified: Secondary | ICD-10-CM

## 2022-01-30 DIAGNOSIS — F419 Anxiety disorder, unspecified: Secondary | ICD-10-CM | POA: Diagnosis not present

## 2022-01-30 NOTE — Progress Notes (Signed)
? ?  Subjective:  ? ? Patient ID: Jacob Crawford, male    DOB: 08-Mar-1956, 66 y.o.   MRN: [id number] ? ?HPI ?HTN- pt reports home BPs have been elevated.  Ranging 128-175/75-84.  Here today BP is 129/63.  Pt isn't sure if home cuff is accurate.  Currently on Amlodipine '10mg'$ , HCTZ 12.'5mg'$ , and Valsartan '320mg'$  daily.  No CP, SOB, HAs, visual changes, edema. ? ?Anxiety/Depression- pt's mom has been dx'd as 'psychotic'.  Has dementia.  Mom has shown up at the house- appears at dusk and then refuses to leave b/c she 'can't drive home'. ? ?Review of Systems ?For ROS see HPI  ? ?This visit occurred during the SARS-CoV-2 public health emergency.  Safety protocols were in place, including screening questions prior to the visit, additional usage of staff PPE, and extensive cleaning of exam room while observing appropriate contact time as indicated for disinfecting solutions.   ?   ?Objective:  ? Physical Exam ?Vitals reviewed.  ?Constitutional:   ?   General: He is not in acute distress. ?   Appearance: Normal appearance. He is well-developed. He is obese. He is not ill-appearing.  ?HENT:  ?   Head: Normocephalic and atraumatic.  ?Eyes:  ?   Extraocular Movements: Extraocular movements intact.  ?   Conjunctiva/sclera: Conjunctivae normal.  ?   Pupils: Pupils are equal, round, and reactive to light.  ?Neck:  ?   Thyroid: No thyromegaly.  ?Cardiovascular:  ?   Rate and Rhythm: Normal rate and regular rhythm.  ?   Pulses: Normal pulses.  ?   Heart sounds: Normal heart sounds. No murmur heard. ?Pulmonary:  ?   Effort: Pulmonary effort is normal. No respiratory distress.  ?   Breath sounds: Normal breath sounds.  ?Abdominal:  ?   General: Bowel sounds are normal. There is no distension.  ?   Palpations: Abdomen is soft.  ?Musculoskeletal:  ?   Cervical back: Normal range of motion and neck supple.  ?   Right lower leg: No edema.  ?   Left lower leg: No edema.  ?Lymphadenopathy:  ?   Cervical: No cervical adenopathy.  ?Skin: ?    General: Skin is warm and dry.  ?Neurological:  ?   General: No focal deficit present.  ?   Mental Status: He is alert and oriented to person, place, and time.  ?   Cranial Nerves: No cranial nerve deficit.  ?Psychiatric:     ?   Mood and Affect: Mood normal.     ?   Behavior: Behavior normal.  ? ? ? ? ? ?   ?Assessment & Plan:  ? ? ?

## 2022-01-30 NOTE — Patient Instructions (Addendum)
Follow up as scheduled or as needed ?Your blood pressure looks great!  No changes today! ?Consider getting a new cuff ?Continue to check your BP at the gym ?We'll call you to schedule your cardiology appt ?Call with any questions or concerns ?Stay Safe!  Stay Healthy! ?

## 2022-02-02 NOTE — Assessment & Plan Note (Signed)
Pt's BP is well controlled here today.  Encouraged him to get a new cuff for better accuracy.  Currently asymptomatic on Amlodipine '10mg'$ , HCTZ 12.'5mg'$ , and Valsartan '320mg'$  daily.  No med changes at this time ?

## 2022-02-02 NOTE — Assessment & Plan Note (Signed)
Ongoing issue for pt.  We had referred him to Cardiology to risk stratify him based on his age, obesity, HTN, DM but he did not return their calls.  Will re-refer for complete evaluation.  Pt expressed understanding and is in agreement w/ plan.  ?

## 2022-02-02 NOTE — Assessment & Plan Note (Signed)
Ongoing issue for pt.  He is currently in counseling which is helping him set boundaries w/ his mother and the rest of the family regarding her care.  Her condition has clearly put a strain on pt and his marriage- wife accompanies him today.  He is currently on Wellbutrin '150mg'$  daily.  Not interested in changing medication at this time.  Would like to wean off once everything has settled.  Discussed how he can get involved w/ mom's care by reaching out to PCP and also reviewed Geri psych services at [place] if needed.  Total time spent counseling pt and wife- 34 minutes. ?

## 2022-02-02 NOTE — Assessment & Plan Note (Signed)
Ongoing issue.  He is down more weight since last visit.  Is going to the gym w/ his wife and working on additional weight loss to decrease the number of medications he takes.  Will continue to follow. ?

## 2022-02-11 ENCOUNTER — Other Ambulatory Visit: Payer: Self-pay | Admitting: Family Medicine

## 2022-03-04 ENCOUNTER — Other Ambulatory Visit: Payer: Self-pay

## 2022-03-04 DIAGNOSIS — Z889 Allergy status to unspecified drugs, medicaments and biological substances status: Secondary | ICD-10-CM | POA: Insufficient documentation

## 2022-03-05 ENCOUNTER — Ambulatory Visit: Payer: Commercial Managed Care - PPO | Admitting: Cardiology

## 2022-03-05 ENCOUNTER — Encounter: Payer: Self-pay | Admitting: Cardiology

## 2022-03-05 VITALS — BP 146/70 | HR 80 | Ht 63.6 in | Wt 239.0 lb

## 2022-03-05 DIAGNOSIS — I10 Essential (primary) hypertension: Secondary | ICD-10-CM | POA: Diagnosis not present

## 2022-03-05 DIAGNOSIS — R079 Chest pain, unspecified: Secondary | ICD-10-CM | POA: Diagnosis not present

## 2022-03-05 DIAGNOSIS — R011 Cardiac murmur, unspecified: Secondary | ICD-10-CM

## 2022-03-05 NOTE — Patient Instructions (Signed)
Medication Instructions:  ?Your physician recommends that you continue on your current medications as directed. Please refer to the Current Medication list given to you today.  ?*If you need a refill on your cardiac medications before your next appointment, please call your pharmacy* ? ? ?Lab Work: ?None ordered ?If you have labs (blood work) drawn today and your tests are completely normal, you will receive your results only by: ?MyChart Message (if you have MyChart) OR ?A paper copy in the mail ?If you have any lab test that is abnormal or we need to change your treatment, we will call you to review the results. ? ? ?Testing/Procedures: ? ?    Stress Echocardiogram Information Sheet ? ?                                                    Instructions: ?   ?1. You may take your morning medications the morning of the test ? ?2. Light breakfast. ? ?3. Dress prepared to exercise. ? ?4. DO NOT use ANY caffeine or tobacco products 3 hours before appointment. ? ?5. Please bring all current prescription medications. ? ?Your physician has requested that you have an echocardiogram. Echocardiography is a painless test that uses sound waves to create images of your heart. It provides your doctor with information about the size and shape of your heart and how well your heart?s chambers and valves are working. This procedure takes approximately one hour. There are no restrictions for this procedure. ? ?We will order CT coronary calcium score. ?It will cost $99.00 and is not covered by insurance.  ?Please call to schedule.   ? ?[facility]  ?[address]  ?[place], [place] [address] ?[phone] ?           Or ?[facility] ?[address] ?[place], [place] [address] ?[phone] ? ?Follow-Up: ?At [facility], you and your health needs are our priority.  As part of our continuing mission to provide you with exceptional heart care, we have created designated Provider Care Teams.  These Care  Teams include your primary Cardiologist (physician) and Advanced Practice Providers (APPs -  Physician Assistants and Nurse Practitioners) who all work together to provide you with the care you need, when you need it. ? ?We recommend signing up for the patient portal called "MyChart".  Sign up information is provided on this After Visit Summary.  MyChart is used to connect with patients for Virtual Visits (Telemedicine).  Patients are able to view lab/test results, encounter notes, upcoming appointments, etc.  Non-urgent messages can be sent to your provider as well.   ?To learn more about what you can do with MyChart, go to [email or web address].   ? ?Your next appointment:   ?6 month(s) ? ?The format for your next appointment:   ?In Person ? ?Provider:   ?Jyl Heinz, MD ? ? ?Other Instructions ?Exercise Stress Echocardiogram ?An exercise stress echocardiogram is a test to check how well your heart is working. This test uses sound waves and a computer to make pictures of your heart. These pictures will be taken before and after you exercise. ?For this test, you will walk on a treadmill or ride a bicycle to make your heart beat faster. While you exercise, your heart will be checked with an electrocardiogram (ECG). Your  blood pressure will also be checked. ?You may have this test if: ?You have chest pain or a heart problem. ?You had a heart attack or heart surgery not long ago. ?You have heart valve problems. ?You have a condition that causes narrowing of the blood vessels that supply your heart. ?You have a high risk of heart disease and: ?You are starting a new exercise program. ?You need to have a big surgery. ?Tell a doctor about: ?Any allergies you have. ?All medicines you are taking. This includes vitamins, herbs, eye drops, creams, and over-the-counter medicines. ?Any problems you or family members have had with medicines that make you fall asleep (anesthetic medicines). ?Any surgeries you have had. ?Any  blood disorders you have. ?Any medical conditions you have. ?Whether you are pregnant or may be pregnant. ?What are the risks? ?Generally, this is a safe test. However, problems may occur, including: ?Chest pain. ?Feeling dizzy or light-headed. ?Shortness of breath. ?Increased or irregular heartbeat. ?Feeling like you may vomit (nausea) or vomiting. ?Heart attack. This is very rare. ?What happens before the test? ?Medicines ?Ask your doctor about changing or stopping your normal medicines. This is important if you take diabetes medicines or blood thinners. ?If you use an inhaler, bring it to the test. ?General instructions ?Wear comfortable clothes and walking shoes. ?Follow instructions from your doctor about what you cannot eat or drink before the test. ?Do not drink or eat anything that has caffeine in it. Stop having caffeine 24 hours before the test. ?Do not smoke or use products that contain nicotine or tobacco for 4 hours before the test. If you need help quitting, ask your doctor. ?What happens during the test? ? ?You will take off your clothes from the waist up and put on a hospital gown. ?Electrodes or patches will be put on your chest. ?A blood pressure cuff will be put on your arm. ?Before you exercise, a computer will make a picture of your heart. To do this: ?You will lie down and a gel will be put on your chest. ?A wand will be moved over the gel. ?Sound waves from the wand will go to the computer to make the picture. ?Then, you will start to exercise. You may walk on a treadmill or pedal a bicycle. ?Your blood pressure and heart rhythm will be checked while you exercise. ?The exercise will get harder or faster. ?You will exercise until: ?Your heart reaches a certain level. ?You are too tired to go on. ?You cannot go on because of chest pain, weakness, or dizziness. ?You will lie down right away so another picture of your heart can be taken. ?The procedure may vary among doctors and hospitals. ?What  can I expect after the test? ?After your test, it is common to have: ?Mild soreness. ?Mild tiredness. ?Your heart rate and blood pressure will be checked until they return to your normal levels. You should not have any new symptoms after this test. ?Follow these instructions at home: ?If your doctor says that you can, you may: ?Eat what you normally eat. ?Do your normal activities. ?Take over-the-counter and prescription medicines only as told by your doctor. ?Keep all follow-up visits. ?It is up to you to get the results of your test. Ask how to get your results when they are ready. ?Contact a doctor if: ?You feel dizzy or light-headed. ?You have a fast or irregular heartbeat. ?You feel like you may vomit or you vomit. ?You have a headache. ?You feel short of  breath. ?Get help right away if: ?You develop pain or pressure: ?In your chest. ?In your jaw or neck. ?Between your shoulders. ?That goes down your left arm. ?You faint. ?You have trouble breathing. ?These symptoms may be an emergency. Get medical help right away. Call your local emergency services (911 in the [place] ?Do not wait to see if the symptoms will go away. ?Do not drive yourself to the hospital. ?Summary ?This is a test that checks how well your heart is working. ?Follow instructions about what you cannot eat or drink before the test. Ask your doctor if you should take your normal medicines before the test. ?Stop having caffeine 24 hours before the test. ?Do not smoke or use products with nicotine or tobacco in them for 4 hours before the test. ?During the test, your blood pressure and heart rhythm will be checked while you exercise. ?This information is not intended to replace advice given to you by your health care provider. Make sure you discuss any questions you have with your health care provider. ?Document Revised: 07/09/2021 Document Reviewed: 06/18/2020 ?Elsevier Patient Education ? [place]. ? ?Coronary Calcium Scan ?A coronary  calcium scan is an imaging test used to look for deposits of plaque in the inner lining of the blood vessels of the heart (coronary arteries). Plaque is made up of calcium, protein, and fatty substances. Thes

## 2022-03-05 NOTE — Progress Notes (Signed)
?Cardiology Office Note:   ? ?Date:  03/05/2022  ? ?ID:  Jacob Crawford, DOB [birth year], MRN [id number] ? ?PCP:  Midge Minium, MD  ?Cardiologist:  Jenean Lindau, MD  ? ?Referring MD: Midge Minium, MD  ? ? ?ASSESSMENT:   ? ?1. Chest pain of uncertain etiology   ?2. Murmur, cardiac   ?3. Morbid obesity ([place])   ?4. Essential hypertension   ? ?PLAN:   ? ?In order of problems listed above: ? ?Primary prevention stressed with the patient.  Importance of compliance with diet medication stressed and vocalized understanding. ?Essential hypertension: Blood pressure stable and diet was emphasized.  Lifestyle modification and salt intake issues were discussed and he vocalized understanding.  He and his wife had multiple questions which were answered to his satisfaction.   ?Obesity: Weight reduction was stressed.  And diet was emphasized.  Lifestyle modification was urged and he promises to do better.  He is already on a journey of losing weight and has lost about 30 pounds. ?Cardiac murmur: Echocardiogram will be done to assess murmur heard on auscultation. ?Chest pain and dyspnea on exertion: Appear atypical for coronary etiology.  He does not have much in terms of risk factors but is concerned about it so we will do a stress echo.  We also discussed calcium scoring for restratification and he is agreeable. ?Patient will be seen in follow-up appointment in 6 months or earlier if the patient has any concerns ? ? ? ?Medication Adjustments/Labs and Tests Ordered: ?Current medicines are reviewed at length with the patient today.  Concerns regarding medicines are outlined above.  ?Orders Placed This Encounter  ?Procedures  ? CT CARDIAC SCORING  ? EKG 12-Lead  ? ECHOCARDIOGRAM COMPLETE  ? ECHOCARDIOGRAM STRESS TEST  ? ?No orders of the defined types were placed in this encounter. ? ? ? ?History of Present Illness:   ? ?Jacob Crawford is a 66 y.o. male who is being seen today for the evaluation of chest pain at the  request of Tabori, Aundra Millet, MD. patient is a pleasant 66 year old male.  He has past medical history of essential hypertension.  Patient mentions to me that he has history of obstructive sleep apnea and is morbidly obese.  He is losing weight.  The was diagnosed to have diabetes mellitus but has controlled it with diet.  He denies any chest pain orthopnea or PND.  He gets some chest discomfort at times not related to exertion and dyspnea on exertion when he exerts more than usual.  At the time of my evaluation, the patient is alert awake oriented and in no distress. ? ?Past Medical History:  ?Diagnosis Date  ? Abdominal pain, epigastric 11/01/2013  ? Allergic rhinitis 03/01/2017  ? Anxiety and depression 02/07/2018  ? Diabetes mellitus without complication ([place])   ? Erectile dysfunction 11/11/2021  ? Essential hypertension 12/20/2012  ? Changed losartan to valsartan due to cough  06/04/2015 >>>   ? H/O seasonal allergies   ? Hematuria 05/20/2015  ? History of 2019 novel coronavirus disease (COVID-19) 09/29/2019  ? Lipoma of back 12/20/2012  ? Meralgia paresthetica of left side 12/20/2012  ? Morbid obesity ([place]) 12/20/2012  ? OSA (obstructive sleep apnea) 03/30/2013  ? Routine general medical examination at a health care facility 12/27/2012  ? Seasonal allergic rhinitis due to pollen 03/01/2017  ? Skin lesion of face 09/05/2015  ? Solitary pulmonary nodule 06/04/2015  ? CT abd 05/20/15 = 4 mm RLL  -  CT chest 05/19/16 Basilar nodules seen on the previous abdomen and pelvis CT are stable in the interval, consistent with benign disease. Patient also has a 7 mm perifissural nodule in the right lung which was present previously but incompletely visualized, most likely representing subpleural lymph node > no f/u needed   ? Upper airway cough syndrome 06/04/2015  ? Followed in Pulmonary clinic/ [facility]/ [place] - prev h/o ACEi cough  - trial off losartan and max gerd rx 06/04/2015 >>>    ? ? ?Past Surgical History:  ?Procedure  Laterality Date  ? [place]  ? skull  ? ? ?Current Medications: ?Current Meds  ?Medication Sig  ? amLODipine (NORVASC) 10 MG tablet Take 1 tablet by mouth once daily  ? Aspirin-Acetaminophen-Caffeine (GOODY HEADACHE PO) Take 1 packet by mouth once as needed for pain or headache.     ? buPROPion (WELLBUTRIN XL) 150 MG 24 hr tablet Take 1 tablet by mouth once daily  ? Cetirizine HCl (ZYRTEC PO) Take 1 tablet by mouth daily.  ? glucose blood (ONETOUCH VERIO) test strip Use as instructed to test sugars 1-2 times daily. Dx. E11.9  ? hydrochlorothiazide (HYDRODIURIL) 12.5 MG tablet Take 1 tablet (12.5 mg total) by mouth daily.  ? Lancets (ONETOUCH ULTRASOFT) lancets Use as instructed to test sugars 1-2 times daily. Dx E11.9  ? sildenafil (VIAGRA) 100 MG tablet Take 0.5-1 tablets (50-100 mg total) by mouth daily as needed for erectile dysfunction.  ? Triamcinolone Acetonide (NASACORT ALLERGY 24HR NA) Place 1-2 sprays into the nose as needed for rhinitis or allergies.  ? valsartan (DIOVAN) 320 MG tablet Take 1 tablet by mouth once daily  ? VENTOLIN HFA 108 (90 Base) MCG/ACT inhaler INHALE 2 PUFFS BY MOUTH EVERY 6 HOURS AS NEEDED FOR WHEEZING FOR SHORTNESS OF BREATH  ?  ? ?Allergies:   Enalapril maleate, Codeine, Hydrochlorothiazide, and Sulfa antibiotics  ? ?Social History  ? ?Socioeconomic History  ? Marital status: Married  ?  Spouse name: Not on file  ? Number of children: Not on file  ? Years of education: Not on file  ? Highest education level: Not on file  ?Occupational History  ? 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   ?  Employer: [employer]  ?  Comment: ended in march 2018  ? Occupation: Superintendant, commercial  ?Tobacco Use  ? Smoking status: Never  ? Smokeless tobacco: Never  ?Vaping Use  ? Vaping Use: Never used  ?Substance and Sexual Activity  ? Alcohol use: No  ? Drug use: No  ? Sexual activity: Not on file  ?Other Topics Concern  ? Not on file  ?Social History Narrative  ? Not on file   ? ?Social Determinants of Health  ? ?Financial Resource Strain: Not on file  ?Food Insecurity: Not on file  ?Transportation Needs: Not on file  ?Physical Activity: Not on file  ?Stress: Not on file  ?Social Connections: Not on file  ?  ? ?Family History: ?The patient's family history includes AAA (abdominal aortic aneurysm) in his father; Arthritis in his maternal grandfather, maternal grandmother, paternal grandfather, and paternal grandmother; COPD in his father; Cancer in his father; Colon cancer in his paternal grandfather; Heart disease in his cousin, father, and maternal grandfather; Hypertension in his father and mother; Mental illness (age of onset: 48) in his son; Sleep apnea in his mother; Stroke in his mother. ? ?ROS:   ?Please see the history of present illness.    ?All other systems  reviewed and are negative. ? ?EKGs/Labs/Other Studies Reviewed:   ? ?The following studies were reviewed today: ?EKG reveals sinus rhythm poor anterior forces and nonspecific ST-T changes ? ? ?Recent Labs: ?11/07/2021: ALT 38; BUN 15; Creat 0.90; Hemoglobin 16.4; Platelets 218; Potassium 3.8; Sodium 142; TSH 1.96  ?Recent Lipid Panel ?   ?Component Value Date/Time  ? CHOL 137 11/07/2021 1516  ? TRIG 101 11/07/2021 1516  ? HDL 42 11/07/2021 1516  ? CHOLHDL 3.3 11/07/2021 1516  ? VLDL 12.8 09/16/2020 0816  ? LDLCALC 77 11/07/2021 1516  ? ? ?Physical Exam:   ? ?VS:  BP (!) 146/70   Pulse 80   Ht 5' 3.6" (1.615 m)   Wt 239 lb 0.6 oz (108.4 kg)   SpO2 96%   BMI 41.55 kg/m?    ? ?Wt Readings from Last 3 Encounters:  ?03/05/22 239 lb 0.6 oz (108.4 kg)  ?01/30/22 236 lb (107 kg)  ?12/15/21 240 lb 12.8 oz (109.2 kg)  ?  ? ?GEN: Patient is in no acute distress ?HEENT: Normal ?NECK: No JVD; No carotid bruits ?LYMPHATICS: No lymphadenopathy ?CARDIAC: S1 S2 regular, 2/6 systolic murmur at the apex. ?RESPIRATORY:  Clear to auscultation without rales, wheezing or rhonchi  ?ABDOMEN: Soft, non-tender, non-distended ?MUSCULOSKELETAL:   No edema; No deformity  ?SKIN: Warm and dry ?NEUROLOGIC:  Alert and oriented x 3 ?PSYCHIATRIC:  Normal affect  ? ? ?Signed, ?Jenean Lindau, MD  ?03/05/2022 4:06 PM    ?[place]   ?

## 2022-03-14 ENCOUNTER — Other Ambulatory Visit: Payer: Self-pay | Admitting: Family Medicine

## 2022-03-16 ENCOUNTER — Other Ambulatory Visit: Payer: Self-pay

## 2022-03-16 ENCOUNTER — Encounter: Payer: Self-pay | Admitting: Family Medicine

## 2022-03-16 ENCOUNTER — Ambulatory Visit (INDEPENDENT_AMBULATORY_CARE_PROVIDER_SITE_OTHER): Payer: Commercial Managed Care - PPO | Admitting: Family Medicine

## 2022-03-16 VITALS — BP 122/76 | HR 71 | Temp 98.1°F | Resp 16 | Ht 63.6 in | Wt 241.6 lb

## 2022-03-16 DIAGNOSIS — E119 Type 2 diabetes mellitus without complications: Secondary | ICD-10-CM

## 2022-03-16 LAB — BASIC METABOLIC PANEL
BUN: 21 mg/dL (ref 6–23)
CO2: 25 mEq/L (ref 19–32)
Calcium: 9 mg/dL (ref 8.4–10.5)
Chloride: 108 mEq/L (ref 96–112)
Creatinine, Ser: 1.01 mg/dL (ref 0.40–1.50)
GFR: 79.23 mL/min (ref >60.00)
Glucose, Bld: 97 mg/dL (ref 70–99)
Potassium: 4.2 mEq/L (ref 3.5–5.1)
Sodium: 141 mEq/L (ref 135–145)

## 2022-03-16 LAB — MICROALBUMIN / CREATININE URINE RATIO
Creatinine,U: 136 mg/dL
Microalb Creat Ratio: 0.5 mg/g (ref 0.0–30.0)
Microalb, Ur: 0.7 mg/dL (ref 0.0–1.9)

## 2022-03-16 LAB — HEMOGLOBIN A1C: Hgb A1c MFr Bld: 5.4 % (ref 4.6–6.5)

## 2022-03-16 MED ORDER — VALSARTAN 320 MG PO TABS: 320.0000 mg | ORAL_TABLET | Freq: Every day | ORAL | 1 refills | Status: DC

## 2022-03-16 NOTE — Patient Instructions (Signed)
Follow up in 3-4 months to recheck BP, cholesterol, and sugar ?We'll notify you of your lab results and make any changes if needed ?Call and schedule your colonoscopy at [phone] ?Continue to work on low carb diet and regular exercise ?Call with any questions or concerns ?Have a great summer!!! ?

## 2022-03-16 NOTE — Assessment & Plan Note (Signed)
Chronic problem.  Currently diet controlled and last A1C 5.4%  UTD on foot exam, eye exam scheduled.  Microalbumin ordered.  Pt continues to exercise regularly- applauded his efforts.  Check labs and determine if medication is needed.  Will follow. ?

## 2022-03-16 NOTE — Progress Notes (Signed)
? ?  Subjective:  ? ? Patient ID: Jacob Crawford, male    DOB: 1956-03-04, 66 y.o.   MRN: [id number] ? ?HPI ?DM- chronic problem.  Currently diet controlled.  On ARB for renal protection.  Eye exam scheduled for July 2023.  UTD on foot exam.  Pt reports feeling 'good'.  Last A1C 5.4%  No CP, SOB, HAs, visual changes, edema.  No abd pain, N/V.  No numbness/tingling of hands/feet.  Continues to exercise 3 nights/week, 60-90 minutes.  Does treadmill, stairs. ? ? ?Review of Systems ?For ROS see HPI  ?   ?Objective:  ? Physical Exam ?Vitals reviewed.  ?Constitutional:   ?   General: He is not in acute distress. ?   Appearance: Normal appearance. He is well-developed. He is obese. He is not ill-appearing.  ?HENT:  ?   Head: Normocephalic and atraumatic.  ?Eyes:  ?   Extraocular Movements: Extraocular movements intact.  ?   Conjunctiva/sclera: Conjunctivae normal.  ?   Pupils: Pupils are equal, round, and reactive to light.  ?Neck:  ?   Thyroid: No thyromegaly.  ?Cardiovascular:  ?   Rate and Rhythm: Normal rate and regular rhythm.  ?   Pulses: Normal pulses.  ?   Heart sounds: Normal heart sounds. No murmur heard. ?Pulmonary:  ?   Effort: Pulmonary effort is normal. No respiratory distress.  ?   Breath sounds: Normal breath sounds.  ?Abdominal:  ?   General: Bowel sounds are normal. There is no distension.  ?   Palpations: Abdomen is soft.  ?Musculoskeletal:  ?   Cervical back: Normal range of motion and neck supple.  ?   Right lower leg: No edema.  ?   Left lower leg: No edema.  ?Lymphadenopathy:  ?   Cervical: No cervical adenopathy.  ?Skin: ?   General: Skin is warm and dry.  ?Neurological:  ?   General: No focal deficit present.  ?   Mental Status: He is alert and oriented to person, place, and time.  ?   Cranial Nerves: No cranial nerve deficit.  ?Psychiatric:     ?   Mood and Affect: Mood normal.     ?   Behavior: Behavior normal.  ? ? ? ? ? ?   ?Assessment & Plan:  ? ? ?

## 2022-03-17 ENCOUNTER — Telehealth: Payer: Self-pay

## 2022-03-17 NOTE — Telephone Encounter (Signed)
-----   Message from Midge Minium, MD sent at 03/17/2022  7:13 AM EDT ----- ?Labs look AMAZING!  Keep up the good work! ?

## 2022-03-17 NOTE — Telephone Encounter (Signed)
Contacted via mychart.

## 2022-03-18 ENCOUNTER — Telehealth (HOSPITAL_COMMUNITY): Payer: Self-pay

## 2022-03-18 NOTE — Telephone Encounter (Signed)
Spoke with the patient, detailed instructions given. He stated that he would be here for his test. Asked to call back with any questions. Jacob Crawford EMTP 

## 2022-03-23 ENCOUNTER — Ambulatory Visit (INDEPENDENT_AMBULATORY_CARE_PROVIDER_SITE_OTHER): Payer: Commercial Managed Care - PPO | Admitting: Family Medicine

## 2022-03-23 ENCOUNTER — Encounter: Payer: Self-pay | Admitting: Family Medicine

## 2022-03-23 VITALS — BP 122/80 | HR 82 | Temp 99.0°F | Resp 16 | Ht 63.6 in | Wt 238.6 lb

## 2022-03-23 DIAGNOSIS — R6883 Chills (without fever): Secondary | ICD-10-CM | POA: Diagnosis not present

## 2022-03-23 LAB — POC COVID19 BINAXNOW: SARS Coronavirus 2 Ag: NEGATIVE

## 2022-03-23 LAB — POCT INFLUENZA A/B
Influenza A, POC: NEGATIVE
Influenza B, POC: NEGATIVE

## 2022-03-23 NOTE — Patient Instructions (Signed)
Follow up as needed or as scheduled ?This seems to be a viral illness that has run through you ?Lots of fluids.  Lots of rest. ?Tylenol for headache or fever ?Call with any questions or concerns ?Hang in there! ?

## 2022-03-23 NOTE — Progress Notes (Signed)
? ?  Subjective:  ? ? Patient ID: MAXIM BEDEL, male    DOB: [birth year], 66 y.o.   MRN: [id number] ? ?HPI ?Fever- pt reports he woke yesterday morning 'feeling terrible'.  + chills, HA, joint pain- particularly lower back pain, nausea, diarrhea.  No cough.  No vomiting.  Denies sinus pain/pressure.  Today pt reports feeling 'washed out'.  Feels like he could've gone to work today.  Pt ate at 2 restaurants but no one else got sick. ? ? ?Review of Systems ?For ROS see HPI  ?   ?Objective:  ? Physical Exam ?Vitals reviewed.  ?Constitutional:   ?   General: He is not in acute distress. ?   Appearance: Normal appearance. He is well-developed. He is obese.  ?HENT:  ?   Head: Normocephalic and atraumatic.  ?   Right Ear: Tympanic membrane normal.  ?   Left Ear: Tympanic membrane normal.  ?   Nose:  ?   Right Sinus: No maxillary sinus tenderness or frontal sinus tenderness.  ?   Left Sinus: No maxillary sinus tenderness or frontal sinus tenderness.  ?   Mouth/Throat:  ?   Pharynx: No oropharyngeal exudate.  ?Cardiovascular:  ?   Rate and Rhythm: Normal rate and regular rhythm.  ?   Pulses: Normal pulses.  ?   Heart sounds: Normal heart sounds.  ?Pulmonary:  ?   Effort: Pulmonary effort is normal. No respiratory distress.  ?   Breath sounds: Normal breath sounds. No wheezing or rales.  ?Abdominal:  ?   General: Abdomen is flat. There is no distension.  ?   Palpations: Abdomen is soft.  ?   Tenderness: There is no abdominal tenderness. There is no guarding or rebound.  ?Musculoskeletal:  ?   Cervical back: Normal range of motion and neck supple.  ?   Right lower leg: No edema.  ?   Left lower leg: No edema.  ?Lymphadenopathy:  ?   Cervical: No cervical adenopathy.  ?Skin: ?   General: Skin is warm.  ?   Findings: No rash.  ?Neurological:  ?   Mental Status: He is alert and oriented to person, place, and time.  ? ? ? ? ? ?   ?Assessment & Plan:  ?Chills- new.  Pt reports feeling better than yesterday.  Today feels 'washed  out'.  Suspect he had a short lived viral illness and he should be on the mend.  Flu and COVID were both negative.  Exam is negative for bacterial infxn.  No abx needed.  Reviewed supportive care and red flags that should prompt return.  Pt expressed understanding and is in agreement w/ plan. r ? ?

## 2022-03-24 ENCOUNTER — Ambulatory Visit (HOSPITAL_COMMUNITY): Payer: Commercial Managed Care - PPO | Attending: Internal Medicine

## 2022-03-24 ENCOUNTER — Ambulatory Visit
Admission: RE | Admit: 2022-03-24 | Discharge: 2022-03-24 | Disposition: A | Discharge status: Home / Self Care | Payer: Self-pay | Source: Ambulatory Visit | Attending: Cardiology | Admitting: Cardiology

## 2022-03-24 ENCOUNTER — Ambulatory Visit (HOSPITAL_BASED_OUTPATIENT_CLINIC_OR_DEPARTMENT_OTHER): Payer: Commercial Managed Care - PPO

## 2022-03-24 ENCOUNTER — Ambulatory Visit (HOSPITAL_COMMUNITY): Payer: Commercial Managed Care - PPO

## 2022-03-24 DIAGNOSIS — R011 Cardiac murmur, unspecified: Secondary | ICD-10-CM | POA: Diagnosis not present

## 2022-03-24 DIAGNOSIS — R079 Chest pain, unspecified: Secondary | ICD-10-CM

## 2022-03-24 LAB — ECHOCARDIOGRAM COMPLETE
Area-P 1/2: 3.48 cm2
S' Lateral: 2.8 cm

## 2022-03-24 MED ORDER — PERFLUTREN LIPID MICROSPHERE
1.0000 mL | INTRAVENOUS | Status: AC | PRN
  Administered 2022-03-24: 3 mL via INTRAVENOUS

## 2022-04-01 ENCOUNTER — Telehealth: Payer: Self-pay

## 2022-04-01 DIAGNOSIS — R931 Abnormal findings on diagnostic imaging of heart and coronary circulation: Secondary | ICD-10-CM

## 2022-04-01 NOTE — Telephone Encounter (Signed)
-----   Message from Jenean Lindau, MD sent at 03/26/2022  9:43 AM EDT ----- Elevated calcium score.  He will need to be on statin therapy.  Please bring him in for Chem-7 liver lipid check in the next few days.  Copy primary care Jenean Lindau, MD 03/26/2022 9:42 AM

## 2022-04-01 NOTE — Telephone Encounter (Signed)
Viewed in [place] Routed to PCP

## 2022-04-08 LAB — LIPID PANEL
Chol/HDL Ratio: 3.5 ratio (ref 0.0–5.0)
Cholesterol, Total: 148 mg/dL (ref 100–199)
HDL: 42 mg/dL (ref >39)
LDL Chol Calc (NIH): 83 mg/dL (ref 0–99)
Triglycerides: 127 mg/dL (ref 0–149)
VLDL Cholesterol Cal: 23 mg/dL (ref 5–40)

## 2022-04-08 LAB — BASIC METABOLIC PANEL
BUN/Creatinine Ratio: 15 (ref 10–24)
BUN: 16 mg/dL (ref 8–27)
CO2: 23 mmol/L (ref 20–29)
Calcium: 9.3 mg/dL (ref 8.6–10.2)
Chloride: 106 mmol/L (ref 96–106)
Creatinine, Ser: 1.09 mg/dL (ref 0.76–1.27)
Glucose: 115 mg/dL — ABNORMAL HIGH (ref 70–99)
Potassium: 4.3 mmol/L (ref 3.5–5.2)
Sodium: 143 mmol/L (ref 134–144)
eGFR: 76 mL/min/{1.73_m2} (ref >59)

## 2022-04-08 LAB — HEPATIC FUNCTION PANEL
ALT: 38 IU/L (ref 0–44)
AST: 24 IU/L (ref 0–40)
Albumin: 4.5 g/dL (ref 3.8–4.8)
Alkaline Phosphatase: 72 IU/L (ref 44–121)
Bilirubin Total: 0.5 mg/dL (ref 0.0–1.2)
Bilirubin, Direct: 0.14 mg/dL (ref 0.00–0.40)
Total Protein: 7 g/dL (ref 6.0–8.5)

## 2022-04-13 MED ORDER — ROSUVASTATIN CALCIUM 10 MG PO TABS: 10.0000 mg | ORAL_TABLET | Freq: Every day | ORAL | 3 refills | Status: DC

## 2022-04-13 NOTE — Addendum Note (Signed)
Addended by: Truddie Hidden on: 04/13/2022 02:05 PM   Modules accepted: Orders

## 2022-04-20 ENCOUNTER — Other Ambulatory Visit: Payer: Self-pay

## 2022-04-20 MED ORDER — ALBUTEROL SULFATE HFA 108 (90 BASE) MCG/ACT IN AERS: 2.0000 | INHALATION_SPRAY | Freq: Four times a day (QID) | RESPIRATORY_TRACT | 0 refills | Status: DC | PRN

## 2022-05-08 ENCOUNTER — Other Ambulatory Visit: Payer: Self-pay | Admitting: Family Medicine

## 2022-05-18 ENCOUNTER — Other Ambulatory Visit: Payer: Self-pay | Admitting: Family Medicine

## 2022-05-19 ENCOUNTER — Other Ambulatory Visit: Payer: Self-pay | Admitting: Family Medicine

## 2022-06-03 LAB — HM DIABETES EYE EXAM

## 2022-06-11 ENCOUNTER — Encounter: Payer: Self-pay | Admitting: Family Medicine

## 2022-06-11 ENCOUNTER — Other Ambulatory Visit: Payer: Self-pay | Admitting: Family Medicine

## 2022-06-13 ENCOUNTER — Other Ambulatory Visit: Payer: Self-pay | Admitting: Family Medicine

## 2022-06-22 ENCOUNTER — Encounter: Payer: Self-pay | Admitting: Family Medicine

## 2022-06-22 ENCOUNTER — Other Ambulatory Visit: Payer: Self-pay

## 2022-06-22 ENCOUNTER — Ambulatory Visit (INDEPENDENT_AMBULATORY_CARE_PROVIDER_SITE_OTHER): Payer: Commercial Managed Care - PPO | Admitting: Family Medicine

## 2022-06-22 VITALS — BP 130/74 | HR 64 | Temp 97.6°F | Resp 18 | Ht 63.6 in | Wt 237.6 lb

## 2022-06-22 DIAGNOSIS — I10 Essential (primary) hypertension: Secondary | ICD-10-CM | POA: Diagnosis not present

## 2022-06-22 DIAGNOSIS — E1169 Type 2 diabetes mellitus with other specified complication: Secondary | ICD-10-CM

## 2022-06-22 DIAGNOSIS — E119 Type 2 diabetes mellitus without complications: Secondary | ICD-10-CM

## 2022-06-22 DIAGNOSIS — E785 Hyperlipidemia, unspecified: Secondary | ICD-10-CM

## 2022-06-22 DIAGNOSIS — R21 Rash and other nonspecific skin eruption: Secondary | ICD-10-CM

## 2022-06-22 HISTORY — DX: Type 2 diabetes mellitus with other specified complication: E11.69

## 2022-06-22 LAB — CBC WITH DIFFERENTIAL/PLATELET
Basophils Absolute: 0.1 10*3/uL (ref 0.0–0.1)
Basophils Relative: 1.4 % (ref 0.0–3.0)
Eosinophils Absolute: 0.1 10*3/uL (ref 0.0–0.7)
Eosinophils Relative: 2.4 % (ref 0.0–5.0)
HCT: 46 % (ref 39.0–52.0)
Hemoglobin: 15.2 g/dL (ref 13.0–17.0)
Lymphocytes Relative: 29.3 % (ref 12.0–46.0)
Lymphs Abs: 1.3 10*3/uL (ref 0.7–4.0)
MCHC: 33 g/dL (ref 30.0–36.0)
MCV: 92.7 fl (ref 78.0–100.0)
Monocytes Absolute: 0.4 10*3/uL (ref 0.1–1.0)
Monocytes Relative: 8.8 % (ref 3.0–12.0)
Neutro Abs: 2.6 10*3/uL (ref 1.4–7.7)
Neutrophils Relative %: 58.1 % (ref 43.0–77.0)
Platelets: 173 10*3/uL (ref 150.0–400.0)
RBC: 4.97 Mil/uL (ref 4.22–5.81)
RDW: 14.1 % (ref 11.5–15.5)
WBC: 4.6 10*3/uL (ref 4.0–10.5)

## 2022-06-22 LAB — BASIC METABOLIC PANEL
BUN: 15 mg/dL (ref 6–23)
CO2: 27 mEq/L (ref 19–32)
Calcium: 9.3 mg/dL (ref 8.4–10.5)
Chloride: 105 mEq/L (ref 96–112)
Creatinine, Ser: 1.04 mg/dL (ref 0.40–1.50)
GFR: 76.35 mL/min (ref >60.00)
Glucose, Bld: 102 mg/dL — ABNORMAL HIGH (ref 70–99)
Potassium: 4.3 mEq/L (ref 3.5–5.1)
Sodium: 140 mEq/L (ref 135–145)

## 2022-06-22 LAB — TSH: TSH: 2.09 u[IU]/mL (ref 0.35–5.50)

## 2022-06-22 LAB — LIPID PANEL
Cholesterol: 97 mg/dL (ref 0–200)
HDL: 42.8 mg/dL (ref >39.00)
LDL Cholesterol: 43 mg/dL (ref 0–99)
NonHDL: 54.26
Total CHOL/HDL Ratio: 2
Triglycerides: 58 mg/dL (ref 0.0–149.0)
VLDL: 11.6 mg/dL (ref 0.0–40.0)

## 2022-06-22 LAB — HEPATIC FUNCTION PANEL
ALT: 29 U/L (ref 0–53)
AST: 21 U/L (ref 0–37)
Albumin: 4.3 g/dL (ref 3.5–5.2)
Alkaline Phosphatase: 47 U/L (ref 39–117)
Bilirubin, Direct: 0.2 mg/dL (ref 0.0–0.3)
Total Bilirubin: 0.7 mg/dL (ref 0.2–1.2)
Total Protein: 7.1 g/dL (ref 6.0–8.3)

## 2022-06-22 LAB — HEMOGLOBIN A1C: Hgb A1c MFr Bld: 5.5 % (ref 4.6–6.5)

## 2022-06-22 MED ORDER — CLOTRIMAZOLE-BETAMETHASONE 1-0.05 % EX CREA: 1.0000 | TOPICAL_CREAM | Freq: Two times a day (BID) | CUTANEOUS | 3 refills | Status: DC

## 2022-06-22 MED ORDER — SILDENAFIL CITRATE 100 MG PO TABS
ORAL_TABLET | ORAL | 3 refills | Status: DC
Start: 2022-06-22 — End: 2023-07-02

## 2022-06-22 NOTE — Assessment & Plan Note (Signed)
Chronic problem.  Currently well controlled on Amlodipine '10mg'$  daily, HCTZ 12.'5mg'$  daily, and Valsartan '320mg'$   Currently asymptomatic.  Check labs due to ARB and diuretic use but no anticipated med changes.

## 2022-06-22 NOTE — Assessment & Plan Note (Signed)
Pt has always had a relatively low LDL but Calcium score was elevated and he was started on Crestor '10mg'$  daily.  Has had bilateral upper leg pain since starting medication consistent w/ myalgias.  Will decrease to '5mg'$  daily and see if this is more tolerable.  Continue CoQ10.  Pt expressed understanding and is in agreement w/ plan.

## 2022-06-22 NOTE — Progress Notes (Signed)
   Subjective:    Patient ID: Jacob Crawford, male    DOB: [birth year], 66 y.o.   MRN: [id number]  HPI DM- chronic problem.  Currently controlled w/ diet and exercise.  UTD on eye exam, foot exam, microalbumin.  Last A1C 5.4%  denies numbness/tingling of hands/feet.  HTN- chronic problem, on Amlodipine '10mg'$  daily, HCTZ 12.'5mg'$  daily, Valsartan '320mg'$  daily w/ adequate control.  No CP, SOB, visual changes, edema.  Hyperlipidemia- chronic problem, on Crestor '10mg'$  daily.  Pt started Crestor 50 days ago and developed a rash on neck 2 weeks ago.  Area is itchy and worsening.  Pt feels this is related to the Crestor as there is nothing else that has changed.  Also having bilateral thigh pain- particularly at night.  On CoQ10.  Denies abd pain, N/V.   Review of Systems For ROS see HPI     Objective:   Physical Exam Vitals reviewed.  Constitutional:      General: He is not in acute distress.    Appearance: Normal appearance. He is well-developed. He is obese. He is not ill-appearing.  HENT:     Head: Normocephalic and atraumatic.  Eyes:     Extraocular Movements: Extraocular movements intact.     Conjunctiva/sclera: Conjunctivae normal.     Pupils: Pupils are equal, round, and reactive to light.  Neck:     Thyroid: No thyromegaly.  Cardiovascular:     Rate and Rhythm: Normal rate and regular rhythm.     Pulses: Normal pulses.     Heart sounds: Normal heart sounds. No murmur heard. Pulmonary:     Effort: Pulmonary effort is normal. No respiratory distress.     Breath sounds: Normal breath sounds.  Abdominal:     General: Bowel sounds are normal. There is no distension.     Palpations: Abdomen is soft.  Musculoskeletal:     Cervical back: Normal range of motion and neck supple.     Right lower leg: No edema.     Left lower leg: No edema.  Lymphadenopathy:     Cervical: No cervical adenopathy.  Skin:    General: Skin is warm and dry.     Findings: Rash (erythematous papular rash  around neck w/ satellite lesions) present.  Neurological:     General: No focal deficit present.     Mental Status: He is alert and oriented to person, place, and time.     Cranial Nerves: No cranial nerve deficit.  Psychiatric:        Mood and Affect: Mood normal.        Behavior: Behavior normal.           Assessment & Plan:  Rash- new.  Area is erythematous and papular and very itchy.  + satellite lesions.  Will start Lotrisone cream to treat possible fungal dermatitis and improve the itching.  Reviewed supportive care and red flags that should prompt return.  Pt expressed understanding and is in agreement w/ plan.

## 2022-06-22 NOTE — Addendum Note (Signed)
Addended by: Midge Minium on: 06/22/2022 08:10 AM   Modules accepted: Orders

## 2022-06-22 NOTE — Patient Instructions (Signed)
Follow up in 3-4 months to recheck sugar and weight loss progress We'll notify you of your lab results and make any changes if needed DECREASE the Crestor to '5mg'$  and we'll see if the leg pain improves USE the Lotrisone cream twice daily to the area on the neck Continue to work on low carb diet and regular exercise- you're doing great! Try and mix up your workout routine- this will jump start your metabolism again Call with any questions or concerns Hang in there!!!

## 2022-06-22 NOTE — Assessment & Plan Note (Signed)
Chronic problem.  Has been able to manage w/ diet and exercise and have excellent control.  Last A1C 5.4%.  UTD on eye exam, foot exam, microalbumin.  Check labs.  Adjust tx prn

## 2022-06-23 ENCOUNTER — Other Ambulatory Visit: Payer: Self-pay

## 2022-06-23 DIAGNOSIS — E1169 Type 2 diabetes mellitus with other specified complication: Secondary | ICD-10-CM

## 2022-06-23 MED ORDER — ROSUVASTATIN CALCIUM 5 MG PO TABS: 5.0000 mg | ORAL_TABLET | Freq: Every day | ORAL | 3 refills | Status: DC

## 2022-06-23 NOTE — Progress Notes (Signed)
Informed pt of lab results  

## 2022-07-07 ENCOUNTER — Other Ambulatory Visit: Payer: Self-pay | Admitting: Family Medicine

## 2022-07-09 ENCOUNTER — Other Ambulatory Visit: Payer: Self-pay | Admitting: Family Medicine

## 2022-08-01 ENCOUNTER — Other Ambulatory Visit: Payer: Self-pay | Admitting: Family Medicine

## 2022-08-07 ENCOUNTER — Other Ambulatory Visit: Payer: Self-pay | Admitting: Family Medicine

## 2022-08-21 ENCOUNTER — Other Ambulatory Visit: Payer: Self-pay | Admitting: Family Medicine

## 2022-09-28 ENCOUNTER — Ambulatory Visit: Payer: Commercial Managed Care - PPO | Attending: Cardiology | Admitting: Cardiology

## 2022-09-28 ENCOUNTER — Encounter: Payer: Self-pay | Admitting: Cardiology

## 2022-09-28 VITALS — BP 134/74 | HR 84 | Ht 63.0 in | Wt 232.1 lb

## 2022-09-28 DIAGNOSIS — E1169 Type 2 diabetes mellitus with other specified complication: Secondary | ICD-10-CM

## 2022-09-28 DIAGNOSIS — I10 Essential (primary) hypertension: Secondary | ICD-10-CM | POA: Diagnosis not present

## 2022-09-28 DIAGNOSIS — R931 Abnormal findings on diagnostic imaging of heart and coronary circulation: Secondary | ICD-10-CM | POA: Diagnosis not present

## 2022-09-28 DIAGNOSIS — E785 Hyperlipidemia, unspecified: Secondary | ICD-10-CM

## 2022-09-28 DIAGNOSIS — G4733 Obstructive sleep apnea (adult) (pediatric): Secondary | ICD-10-CM

## 2022-09-28 NOTE — Progress Notes (Signed)
Cardiology Office Note:    Date:  09/28/2022   ID:  Jacob Crawford, DOB [birth year], MRN [id number]  PCP:  Midge Minium, MD  Cardiologist:  Jenean Lindau, MD   Referring MD: Midge Minium, MD    ASSESSMENT:    1. Elevated coronary artery calcium score   2. Essential hypertension   3. OSA (obstructive sleep apnea)   4. Hyperlipidemia associated with type 2 diabetes mellitus ([place])   5. Morbid obesity ([place])    PLAN:    In order of problems listed above:  Coronary artery disease: Secondary prevention stressed with the patient.  Importance of compliance with diet and medication stressed any vocalized understanding.  He was advised to walk at least half an hour a day 5 days a week and he promises to do so. Essential hypertension: Blood pressure stable and diet was emphasized. Mixed dyslipidemia: Lipids were reviewed.  He has some leg cramping with the medication rosuvastatin.  We will get a Chem-7 liver panel and change it to Livalo 1 mg daily and follow-up in 6 weeks for liver lipid check. Obesity: Weight reduction stressed and diet was emphasized.  He promises to do better.  Risks of obesity explained. Patient will be seen in follow-up appointment in 6 months or earlier if the patient has any concerns    Medication Adjustments/Labs and Tests Ordered: Current medicines are reviewed at length with the patient today.  Concerns regarding medicines are outlined above.  Orders Placed This Encounter  Procedures   Comprehensive metabolic panel   Lipid panel   No orders of the defined types were placed in this encounter.    No chief complaint on file.    History of Present Illness:    Jacob Crawford is a 66 y.o. male.  Patient has past medical history of markedly elevated calcium score, essential hypertension, dyslipidemia and obesity.  He denies any problems at this time and takes care of activities of daily living.  He exercises vigorously 3 times a week and he  explained his exercise protocol to me.  At the time of my evaluation, the patient is alert awake oriented and in no distress.  Past Medical History:  Diagnosis Date   Abdominal pain, epigastric 11/01/2013   Allergic rhinitis 03/01/2017   Anxiety and depression 02/07/2018   Diabetes mellitus without complication ([place])    Erectile dysfunction 11/11/2021   Essential hypertension 12/20/2012   Changed losartan to valsartan due to cough  06/04/2015 >>>    H/O seasonal allergies    Hematuria 05/20/2015   History of 2019 novel coronavirus disease (COVID-19) 09/29/2019   Hyperlipidemia associated with type 2 diabetes mellitus ([place]) 06/22/2022   Lipoma of back 12/20/2012   Meralgia paresthetica of left side 12/20/2012   Morbid obesity ([place]) 12/20/2012   OSA (obstructive sleep apnea) 03/30/2013   Routine general medical examination at a health care facility 12/27/2012   Seasonal allergic rhinitis due to pollen 03/01/2017   Skin lesion of face 09/05/2015   Solitary pulmonary nodule 06/04/2015   CT abd 05/20/15 = 4 mm RLL  - CT chest 05/19/16 Basilar nodules seen on the previous abdomen and pelvis CT are stable in the interval, consistent with benign disease. Patient also has a 7 mm perifissural nodule in the right lung which was present previously but incompletely visualized, most likely representing subpleural lymph node > no f/u needed    Upper airway cough syndrome 06/04/2015   Followed in Pulmonary clinic/ [facility]/ [place] -  prev h/o ACEi cough  - trial off losartan and max gerd rx 06/04/2015 >>>      Past Surgical History:  Procedure Laterality Date   FRACTURE SURGERY  1965   skull    Current Medications: Current Meds  Medication Sig   albuterol (VENTOLIN HFA) 108 (90 Base) MCG/ACT inhaler Inhale 2 puffs into the lungs every 6 (six) hours as needed for wheezing or shortness of breath.   amLODipine (NORVASC) 10 MG tablet Take 1 tablet by mouth once daily    Aspirin-Acetaminophen-Caffeine (GOODY HEADACHE PO) Take 1 packet by mouth once as needed for pain or headache.      buPROPion (WELLBUTRIN XL) 150 MG 24 hr tablet Take 1 tablet by mouth once daily   Cetirizine HCl (ZYRTEC PO) Take 1 tablet by mouth daily.   clotrimazole-betamethasone (LOTRISONE) cream Apply 1 Application topically 2 (two) times daily.   glucose blood (ONETOUCH VERIO) test strip USE AS INSTRUCTED TO TEST SUGARS 1 TO 2 TIMES DAILY   hydrochlorothiazide (HYDRODIURIL) 12.5 MG tablet Take 1 tablet by mouth once daily   Lancets (ONETOUCH ULTRASOFT) lancets Use as instructed to test sugars 1-2 times daily. Dx E11.9   rosuvastatin (CRESTOR) 5 MG tablet Take 1 tablet (5 mg total) by mouth daily.   sildenafil (VIAGRA) 100 MG tablet TAKE 1/2 TO 1 (ONE-HALF TO ONE) TABLET BY MOUTH ONCE DAILY AS NEEDED FOR ERECTILE DYSFUNCTION   Triamcinolone Acetonide (NASACORT ALLERGY 24HR NA) Place 1-2 sprays into the nose as needed for rhinitis or allergies.   valsartan (DIOVAN) 320 MG tablet Take 1 tablet by mouth once daily   valsartan (DIOVAN) 320 MG tablet Take 1 tablet (320 mg total) by mouth daily.   VENTOLIN HFA 108 (90 Base) MCG/ACT inhaler INHALE 2 PUFFS BY MOUTH EVERY 6 HOURS AS NEEDED FOR WHEEZING FOR SHORTNESS OF BREATH     Allergies:   Enalapril maleate, Codeine, Hydrochlorothiazide, and Sulfa antibiotics   Social History   Socioeconomic History   Marital status: Married    Spouse name: Not on file   Number of children: Not on file   Years of education: Not on file   Highest education level: Not on file  Occupational History   Occupation: [place].     Employer: [employer]    Comment: ended in march 2018   Occupation: Superintendant, Chiropractor  Tobacco Use   Smoking status: Never   Smokeless tobacco: Never  Vaping Use   Vaping Use: Never used  Substance and Sexual Activity   Alcohol use: No   Drug use: No   Sexual activity: Not on file  Other Topics Concern    Not on file  Social History Narrative   Not on file   Social Determinants of Health   Financial Resource Strain: Not on file  Food Insecurity: Not on file  Transportation Needs: Not on file  Physical Activity: Not on file  Stress: Not on file  Social Connections: Not on file     Family History: The patient's family history includes AAA (abdominal aortic aneurysm) in his father; Arthritis in his maternal grandfather, maternal grandmother, paternal grandfather, and paternal grandmother; COPD in his father; Cancer in his father; Colon cancer in his paternal grandfather; Heart disease in his cousin, father, and maternal grandfather; Hypertension in his father and mother; Mental illness (age of onset: 60) in his son; Sleep apnea in his mother; Stroke in his mother.  ROS:   Please see the history of present illness.  All other systems reviewed and are negative.  EKGs/Labs/Other Studies Reviewed:    The following studies were reviewed today: I discussed my findings with the patient at length.   Recent Labs: 06/22/2022: ALT 29; BUN 15; Creatinine, Ser 1.04; Hemoglobin 15.2; Platelets 173.0; Potassium 4.3; Sodium 140; TSH 2.09  Recent Lipid Panel    Component Value Date/Time   CHOL 97 06/22/2022 0811   CHOL 148 04/07/2022 0810   TRIG 58.0 06/22/2022 0811   HDL 42.80 06/22/2022 0811   HDL 42 04/07/2022 0810   CHOLHDL 2 06/22/2022 0811   VLDL 11.6 06/22/2022 0811   LDLCALC 43 06/22/2022 0811   LDLCALC 83 04/07/2022 0810   LDLCALC 77 11/07/2021 1516    Physical Exam:    VS:  BP 134/74   Pulse 84   Ht '5\' 3"'$  (1.6 m)   Wt 232 lb 1.3 oz (105.3 kg)   SpO2 94%   BMI 41.11 kg/m     Wt Readings from Last 3 Encounters:  09/28/22 232 lb 1.3 oz (105.3 kg)  06/22/22 237 lb 9.6 oz (107.8 kg)  03/23/22 238 lb 9.6 oz (108.2 kg)     GEN: Patient is in no acute distress HEENT: Normal NECK: No JVD; No carotid bruits LYMPHATICS: No lymphadenopathy CARDIAC: Hear sounds regular, 2/6  systolic murmur at the apex. RESPIRATORY:  Clear to auscultation without rales, wheezing or rhonchi  ABDOMEN: Soft, non-tender, non-distended MUSCULOSKELETAL:  No edema; No deformity  SKIN: Warm and dry NEUROLOGIC:  Alert and oriented x 3 PSYCHIATRIC:  Normal affect   Signed, Jenean Lindau, MD  09/28/2022 4:43 PM    [facility]

## 2022-09-28 NOTE — Patient Instructions (Addendum)
Medication Instructions:  Your physician recommends that you continue on your current medications as directed. Please refer to the Current Medication list given to you today.  *If you need a refill on your cardiac medications before your next appointment, please call your pharmacy*   Lab Work: Your physician recommends that you return for lab work in: the next few days and then 6 weeks after medication adjustment. You need to have labs done when you are fasting. You do not need to make an appointment as the order has already been placed. The labs you are going to have done are BMET,  TSH, LFT and Lipids.  [facility] [address] in [place]. They also close daily for lunch for 12-1.   [address] 2nd floor M-W 8-11:30 and 1-4:30 and Thursday and Friday 8-11:30.  If you have labs (blood work) drawn today and your tests are completely normal, you will receive your results only by: [place] (if you have MyChart) OR A paper copy in the mail If you have any lab test that is abnormal or we need to change your treatment, we will call you to review the results.   Testing/Procedures: None ordered   Follow-Up: At [facility], you and your health needs are our priority.  As part of our continuing mission to provide you with exceptional heart care, we have created designated Provider Care Teams.  These Care Teams include your primary Cardiologist (physician) and Advanced Practice Providers (APPs -  Physician Assistants and Nurse Practitioners) who all work together to provide you with the care you need, when you need it.  We recommend signing up for the patient portal called "MyChart".  Sign up information is provided on this After Visit Summary.  MyChart is used to connect with patients for Virtual Visits (Telemedicine).  Patients are able to view lab/test results, encounter notes, upcoming appointments, etc.  Non-urgent messages can be sent to your provider as  well.   To learn more about what you can do with MyChart, go to [email or web address].    Your next appointment:   12 month(s)  The format for your next appointment:   In Person  Provider:   Jyl Heinz, MD   Other Instructions NA

## 2022-10-09 LAB — COMPREHENSIVE METABOLIC PANEL
ALT: 25 IU/L (ref 0–44)
AST: 18 IU/L (ref 0–40)
Albumin/Globulin Ratio: 1.8 (ref 1.2–2.2)
Albumin: 4.3 g/dL (ref 3.9–4.9)
Alkaline Phosphatase: 67 IU/L (ref 44–121)
BUN/Creatinine Ratio: 14 (ref 10–24)
BUN: 14 mg/dL (ref 8–27)
Bilirubin Total: 0.6 mg/dL (ref 0.0–1.2)
CO2: 23 mmol/L (ref 20–29)
Calcium: 9.2 mg/dL (ref 8.6–10.2)
Chloride: 104 mmol/L (ref 96–106)
Creatinine, Ser: 0.98 mg/dL (ref 0.76–1.27)
Globulin, Total: 2.4 g/dL (ref 1.5–4.5)
Glucose: 104 mg/dL — ABNORMAL HIGH (ref 70–99)
Potassium: 4.3 mmol/L (ref 3.5–5.2)
Sodium: 142 mmol/L (ref 134–144)
Total Protein: 6.7 g/dL (ref 6.0–8.5)
eGFR: 87 mL/min/{1.73_m2} (ref >59)

## 2022-10-09 LAB — LIPID PANEL
Chol/HDL Ratio: 2.4 ratio (ref 0.0–5.0)
Cholesterol, Total: 89 mg/dL — ABNORMAL LOW (ref 100–199)
HDL: 37 mg/dL — ABNORMAL LOW (ref >39)
LDL Chol Calc (NIH): 35 mg/dL (ref 0–99)
Triglycerides: 81 mg/dL (ref 0–149)
VLDL Cholesterol Cal: 17 mg/dL (ref 5–40)

## 2022-10-12 ENCOUNTER — Telehealth: Payer: Self-pay | Admitting: Cardiology

## 2022-10-12 DIAGNOSIS — E1169 Type 2 diabetes mellitus with other specified complication: Secondary | ICD-10-CM

## 2022-10-12 DIAGNOSIS — R931 Abnormal findings on diagnostic imaging of heart and coronary circulation: Secondary | ICD-10-CM

## 2022-10-12 DIAGNOSIS — E785 Hyperlipidemia, unspecified: Secondary | ICD-10-CM

## 2022-10-12 MED ORDER — LIVALO 1 MG PO TABS: 1.0000 mg | ORAL_TABLET | Freq: Every day | ORAL | 12 refills | Status: DC

## 2022-10-12 NOTE — Telephone Encounter (Signed)
  Patient is calling stating that at last visit Dr Larina Bras was supposed to change his rosuvastatin (CRESTOR) 5 MG tablet to something else due to pain he has been having. He is following up on this because he has not heard anything else in regards to a med change. Please advise.

## 2022-10-12 NOTE — Telephone Encounter (Signed)
Left vm for pt to callback with questions and MyChart message sent.

## 2022-10-13 ENCOUNTER — Telehealth: Payer: Self-pay

## 2022-10-13 NOTE — Telephone Encounter (Signed)
PA submitted on CMM for Livalo 1 mg. Key [id number]

## 2022-10-20 ENCOUNTER — Other Ambulatory Visit: Payer: Self-pay | Admitting: Family Medicine

## 2022-10-26 ENCOUNTER — Encounter: Payer: Self-pay | Admitting: Family Medicine

## 2022-10-26 ENCOUNTER — Ambulatory Visit (INDEPENDENT_AMBULATORY_CARE_PROVIDER_SITE_OTHER): Payer: Commercial Managed Care - PPO | Admitting: Family Medicine

## 2022-10-26 VITALS — BP 120/76 | HR 80 | Temp 98.9°F | Resp 17 | Ht 63.0 in | Wt 242.0 lb

## 2022-10-26 DIAGNOSIS — E119 Type 2 diabetes mellitus without complications: Secondary | ICD-10-CM | POA: Diagnosis not present

## 2022-10-26 DIAGNOSIS — R059 Cough, unspecified: Secondary | ICD-10-CM | POA: Diagnosis not present

## 2022-10-26 DIAGNOSIS — R509 Fever, unspecified: Secondary | ICD-10-CM | POA: Diagnosis not present

## 2022-10-26 LAB — BASIC METABOLIC PANEL
BUN: 15 mg/dL (ref 6–23)
CO2: 25 mEq/L (ref 19–32)
Calcium: 8.7 mg/dL (ref 8.4–10.5)
Chloride: 110 mEq/L (ref 96–112)
Creatinine, Ser: 0.99 mg/dL (ref 0.40–1.50)
GFR: 80.8 mL/min (ref >60.00)
Glucose, Bld: 100 mg/dL — ABNORMAL HIGH (ref 70–99)
Potassium: 4.2 mEq/L (ref 3.5–5.1)
Sodium: 143 mEq/L (ref 135–145)

## 2022-10-26 LAB — HEMOGLOBIN A1C: Hgb A1c MFr Bld: 5.4 % (ref 4.6–6.5)

## 2022-10-26 NOTE — Progress Notes (Signed)
   Subjective:    Patient ID: Jacob Crawford, male    DOB: 1955/12/05, 66 y.o.   MRN: [id number]  HPI DM- chronic problem.  Currently attempting to control w/ diet and exercise.  Unfortunately has gained 10 lbs since last visit.  On ARB for renal protection.  UTD on eye exam, foot exam, microalbumin.  No CP, SOB, HA's, visual changes, abd pain, N/V.  No numbness or tingling of hands/feet.  Last A1C 5.5%  Obesity- pt has gained 10 lbs since last visit.  Did weigh in steel toed boots today.  Pt's exercise slowed due to pulled muscle in back.  He is getting back into his regular routine.  Cardiology wants pt exercising 5x/week (up from his usual 3x/week)   Review of Systems For ROS see HPI     Objective:   Physical Exam Vitals reviewed.  Constitutional:      General: He is not in acute distress.    Appearance: Normal appearance. He is well-developed. He is obese. He is not ill-appearing.  HENT:     Head: Normocephalic and atraumatic.  Eyes:     Extraocular Movements: Extraocular movements intact.     Conjunctiva/sclera: Conjunctivae normal.     Pupils: Pupils are equal, round, and reactive to light.  Neck:     Thyroid: No thyromegaly.  Cardiovascular:     Rate and Rhythm: Normal rate and regular rhythm.     Pulses: Normal pulses.     Heart sounds: Normal heart sounds. No murmur heard. Pulmonary:     Effort: Pulmonary effort is normal. No respiratory distress.     Breath sounds: Normal breath sounds.  Abdominal:     General: Bowel sounds are normal. There is no distension.     Palpations: Abdomen is soft.  Musculoskeletal:     Cervical back: Normal range of motion and neck supple.     Right lower leg: No edema.     Left lower leg: No edema.  Lymphadenopathy:     Cervical: No cervical adenopathy.  Skin:    General: Skin is warm and dry.  Neurological:     General: No focal deficit present.     Mental Status: He is alert and oriented to person, place, and time.     Cranial  Nerves: No cranial nerve deficit.  Psychiatric:        Mood and Affect: Mood normal.        Behavior: Behavior normal.           Assessment & Plan:

## 2022-10-26 NOTE — Assessment & Plan Note (Signed)
Deteriorated.  Pt has gained 10 lbs since last visit.  Encouraged him to continue his work on low carb/low sugar diet and regular exercise.  Will continue to follow.

## 2022-10-26 NOTE — Patient Instructions (Signed)
Schedule your complete physical in 3-4 months We'll notify you of your lab results and make any changes if needed Continue to work on low carb, low sugar diet and regular exercise- you can do it! Call with any questions or concerns Stay Safe!  Stay Healthy! Happy Early [place]!! Happy Holidays!!

## 2022-10-26 NOTE — Assessment & Plan Note (Signed)
Chronic problem.  Last A1C 5.5%  UTD on eye exam, foot exam, microalbumin.  Check labs and determine if meds are needed.

## 2022-10-27 ENCOUNTER — Telehealth: Payer: Self-pay

## 2022-10-27 NOTE — Telephone Encounter (Signed)
Informed pt of lab results  

## 2022-10-27 NOTE — Telephone Encounter (Signed)
-----   Message from Midge Minium, MD sent at 10/27/2022  7:30 AM EST ----- Labs look great!  No changes at this time

## 2022-10-28 ENCOUNTER — Telehealth (INDEPENDENT_AMBULATORY_CARE_PROVIDER_SITE_OTHER): Payer: Commercial Managed Care - PPO | Admitting: Family Medicine

## 2022-10-28 ENCOUNTER — Encounter: Payer: Self-pay | Admitting: Family Medicine

## 2022-10-28 VITALS — Ht 63.0 in | Wt 242.0 lb

## 2022-10-28 DIAGNOSIS — R059 Cough, unspecified: Secondary | ICD-10-CM

## 2022-10-28 DIAGNOSIS — R509 Fever, unspecified: Secondary | ICD-10-CM | POA: Diagnosis not present

## 2022-10-28 DIAGNOSIS — J101 Influenza due to other identified influenza virus with other respiratory manifestations: Secondary | ICD-10-CM

## 2022-10-28 LAB — POCT INFLUENZA A/B
Influenza A, POC: POSITIVE — AB
Influenza B, POC: NEGATIVE

## 2022-10-28 LAB — POC COVID19 BINAXNOW: SARS Coronavirus 2 Ag: NEGATIVE

## 2022-10-28 MED ORDER — ALBUTEROL SULFATE HFA 108 (90 BASE) MCG/ACT IN AERS: 2.0000 | INHALATION_SPRAY | Freq: Four times a day (QID) | RESPIRATORY_TRACT | 0 refills | Status: DC | PRN

## 2022-10-28 MED ORDER — OSELTAMIVIR PHOSPHATE 75 MG PO CAPS: 75.0000 mg | ORAL_CAPSULE | Freq: Two times a day (BID) | ORAL | 0 refills | Status: AC

## 2022-10-28 MED ORDER — BENZONATATE 100 MG PO CAPS: 100.0000 mg | ORAL_CAPSULE | Freq: Two times a day (BID) | ORAL | 0 refills | Status: DC | PRN

## 2022-10-28 NOTE — Progress Notes (Signed)
Virtual Visit via Video Note  I connected with Jacob Crawford on 10/28/22 at  1:30 PM EST by a video enabled telemedicine application 2/2 [id number] pandemic and verified that I am speaking with the correct person using two identifiers.  Location patient: home Location provider:work or home office Persons participating in the virtual visit: patient, provider  I discussed the limitations of evaluation and management by telemedicine and the availability of in person appointments. The patient expressed understanding and agreed to proceed.  Chief Complaint  Patient presents with   Cough   Influenza    Pt reports sx of productive cough- yellow phlegm, fever, headache, fatigue, bodyache. No sore throat. Sx started on Tuesday Morning. OTC cough medication. Use inhaler.      HPI: Pt is a 66 yo male with pmh sig for HTN, allergic rhinitis, OSA, DM, anxiety and depression followed by Dr. Birdie Riddle and seen for acute concern.  Pt had a HA last night.  Then developed cough, mild fatigue, ears itchy and painful, tightness in chest, Temp  100 F, rhinorrhea. Pt denies sick contacts.  Went fishing on Sunday, one of the guys on the trip tested positive for influenza A.  Tried Tussonex and inhaler.  ROS: See pertinent positives and negatives per HPI.  Past Medical History:  Diagnosis Date   Abdominal pain, epigastric 11/01/2013   Allergic rhinitis 03/01/2017   Anxiety and depression 02/07/2018   Diabetes mellitus without complication ([place])    Erectile dysfunction 11/11/2021   Essential hypertension 12/20/2012   Changed losartan to valsartan due to cough  06/04/2015 >>>    H/O seasonal allergies    Hematuria 05/20/2015   History of 2019 novel coronavirus disease (COVID-19) 09/29/2019   Hyperlipidemia associated with type 2 diabetes mellitus ([place]) 06/22/2022   Lipoma of back 12/20/2012   Meralgia paresthetica of left side 12/20/2012   Morbid obesity ([place]) 12/20/2012   OSA (obstructive sleep apnea)  03/30/2013   Routine general medical examination at a health care facility 12/27/2012   Seasonal allergic rhinitis due to pollen 03/01/2017   Skin lesion of face 09/05/2015   Solitary pulmonary nodule 06/04/2015   CT abd 05/20/15 = 4 mm RLL  - CT chest 05/19/16 Basilar nodules seen on the previous abdomen and pelvis CT are stable in the interval, consistent with benign disease. Patient also has a 7 mm perifissural nodule in the right lung which was present previously but incompletely visualized, most likely representing subpleural lymph node > no f/u needed    Upper airway cough syndrome 06/04/2015   Followed in Pulmonary clinic/ [facility]/ [facility] - prev h/o ACEi cough  - trial off losartan and max gerd rx 06/04/2015 >>>      Past Surgical History:  Procedure Laterality Date   FRACTURE SURGERY  1965   skull    Family History  Problem Relation Age of Onset   Arthritis Maternal Grandmother    Arthritis Maternal Grandfather    Heart disease Maternal Grandfather        MI   Arthritis Paternal Grandmother    Arthritis Paternal Grandfather    Colon cancer Paternal Grandfather        died with colon cancer at age 7   Sleep apnea Mother    Stroke Mother    Hypertension Mother    Heart disease Father        multiple stent placements.   COPD Father        smoked   Hypertension Father  AAA (abdominal aortic aneurysm) Father    Cancer Father        ureter   Heart disease Cousin        MI   Mental illness Son 1       anxiety    Current Outpatient Medications:    albuterol (VENTOLIN HFA) 108 (90 Base) MCG/ACT inhaler, Inhale 2 puffs into the lungs every 6 (six) hours as needed for wheezing or shortness of breath., Disp: 8 g, Rfl: 0   amLODipine (NORVASC) 10 MG tablet, Take 1 tablet by mouth once daily, Disp: 90 tablet, Rfl: 0   Aspirin-Acetaminophen-Caffeine (GOODY HEADACHE PO), Take 1 packet by mouth once as needed for pain or headache.   , Disp: , Rfl:    buPROPion  (WELLBUTRIN XL) 150 MG 24 hr tablet, Take 1 tablet by mouth once daily, Disp: 90 tablet, Rfl: 0   glucose blood (ONETOUCH VERIO) test strip, USE AS INSTRUCTED TO TEST SUGARS 1 TO 2 TIMES DAILY, Disp: 100 each, Rfl: 0   hydrochlorothiazide (HYDRODIURIL) 12.5 MG tablet, Take 1 tablet by mouth once daily, Disp: 30 tablet, Rfl: 0   Lancets (ONETOUCH ULTRASOFT) lancets, Use as instructed to test sugars 1-2 times daily. Dx E11.9, Disp: 100 each, Rfl: 12   rosuvastatin (CRESTOR) 5 MG tablet, Take 5 mg by mouth daily., Disp: , Rfl:    sildenafil (VIAGRA) 100 MG tablet, TAKE 1/2 TO 1 (ONE-HALF TO ONE) TABLET BY MOUTH ONCE DAILY AS NEEDED FOR ERECTILE DYSFUNCTION, Disp: 30 tablet, Rfl: 3   valsartan (DIOVAN) 320 MG tablet, Take 1 tablet (320 mg total) by mouth daily., Disp: 90 tablet, Rfl: 1   VENTOLIN HFA 108 (90 Base) MCG/ACT inhaler, INHALE 2 PUFFS BY MOUTH EVERY 6 HOURS AS NEEDED FOR WHEEZING FOR SHORTNESS OF BREATH, Disp: 18 g, Rfl: 0   Cetirizine HCl (ZYRTEC PO), Take 1 tablet by mouth daily. (Patient not taking: Reported on 10/28/2022), Disp: , Rfl:    clotrimazole-betamethasone (LOTRISONE) cream, Apply 1 Application topically 2 (two) times daily. (Patient not taking: Reported on 10/28/2022), Disp: 45 g, Rfl: 3   Pitavastatin Calcium (LIVALO) 1 MG TABS, Take 1 tablet (1 mg total) by mouth daily. (Patient not taking: Reported on 10/26/2022), Disp: 30 tablet, Rfl: 12   Triamcinolone Acetonide (NASACORT ALLERGY 24HR NA), Place 1-2 sprays into the nose as needed for rhinitis or allergies. (Patient not taking: Reported on 10/28/2022), Disp: , Rfl:   EXAM:  VITALS per patient if applicable:  RR between 12-20 bpm  GENERAL: alert, oriented, appears well and in no acute distress  HEENT: atraumatic, conjunctiva clear, no obvious abnormalities on inspection of external nose and ears  NECK: normal movements of the head and neck  LUNGS: Cough, on inspection no signs of respiratory distress, breathing rate  appears normal, no obvious gross SOB, gasping or wheezing  CV: no obvious cyanosis  MS: moves all visible extremities without noticeable abnormality  PSYCH/NEURO: pleasant and cooperative, no obvious depression or anxiety, speech and thought processing grossly intact  ASSESSMENT AND PLAN:  Discussed the following assessment and plan:  Influenza A - Plan: oseltamivir (TAMIFLU) 75 MG capsule  Cough, unspecified type - Plan: POC COVID-19, POC Influenza A/B, albuterol (VENTOLIN HFA) 108 (90 Base) MCG/ACT inhaler, benzonatate (TESSALON) 100 MG capsule  Fever, unspecified fever cause - Plan: POC COVID-19, POC Influenza A/B  Influenza A positive in clinic. Covid negative.  Start tamiflu as symptoms started less than 48 hrs ago.  Continue supportive care.  Albuterol inhaler  refilled.  Tessalon sent to pharmacy.  Given precautions.  F/u prn   I discussed the assessment and treatment plan with the patient. The patient was provided an opportunity to ask questions and all were answered. The patient agreed with the plan and demonstrated an understanding of the instructions.   The patient was advised to call back or seek an in-person evaluation if the symptoms worsen or if the condition fails to improve as anticipated.   Billie Ruddy, MD

## 2022-11-03 ENCOUNTER — Encounter: Payer: Self-pay | Admitting: Family Medicine

## 2022-11-04 NOTE — Telephone Encounter (Signed)
PA denied for Livalo 1 mg.

## 2022-11-06 ENCOUNTER — Other Ambulatory Visit: Payer: Self-pay | Admitting: Family Medicine

## 2022-11-14 ENCOUNTER — Other Ambulatory Visit: Payer: Self-pay | Admitting: Family Medicine

## 2022-11-16 ENCOUNTER — Encounter: Payer: Self-pay | Admitting: Family Medicine

## 2022-11-16 ENCOUNTER — Other Ambulatory Visit: Payer: Self-pay

## 2022-11-16 MED ORDER — HYDROCHLOROTHIAZIDE 12.5 MG PO TABS: 12.5000 mg | ORAL_TABLET | Freq: Every day | ORAL | 0 refills | Status: DC

## 2022-11-16 MED ORDER — VALSARTAN 320 MG PO TABS: 320.0000 mg | ORAL_TABLET | Freq: Every day | ORAL | 0 refills | Status: DC

## 2022-12-10 ENCOUNTER — Encounter: Payer: Self-pay | Admitting: Family Medicine

## 2023-01-15 ENCOUNTER — Encounter: Payer: Commercial Managed Care - PPO | Admitting: Family Medicine

## 2023-01-15 ENCOUNTER — Other Ambulatory Visit: Payer: Self-pay

## 2023-01-15 ENCOUNTER — Telehealth: Payer: Self-pay | Admitting: Family Medicine

## 2023-01-15 DIAGNOSIS — I10 Essential (primary) hypertension: Secondary | ICD-10-CM

## 2023-01-15 DIAGNOSIS — R21 Rash and other nonspecific skin eruption: Secondary | ICD-10-CM

## 2023-01-15 MED ORDER — VALSARTAN 320 MG PO TABS: 320.0000 mg | ORAL_TABLET | Freq: Every day | ORAL | 0 refills | Status: DC

## 2023-01-15 NOTE — Telephone Encounter (Signed)
Encourage patient to contact the pharmacy for refills or they can request refills through [place]: Jacob Crawford, Jacob (Marmet) 320 MG tablet   NOTES/COMMENTS FROM PATIENT:      [place] office please notify patient: It takes 48-72 hours to process rx refill requests Ask patient to call pharmacy to ensure rx is ready before heading there.

## 2023-01-15 NOTE — Telephone Encounter (Signed)
Pt refill has been sent to [facility]

## 2023-01-22 ENCOUNTER — Ambulatory Visit (INDEPENDENT_AMBULATORY_CARE_PROVIDER_SITE_OTHER): Payer: Commercial Managed Care - PPO | Admitting: Family Medicine

## 2023-01-22 ENCOUNTER — Encounter: Payer: Self-pay | Admitting: Family Medicine

## 2023-01-22 VITALS — BP 138/78 | HR 82 | Temp 97.9°F | Resp 17 | Ht 63.0 in | Wt 252.4 lb

## 2023-01-22 DIAGNOSIS — Z125 Encounter for screening for malignant neoplasm of prostate: Secondary | ICD-10-CM

## 2023-01-22 DIAGNOSIS — Z0000 Encounter for general adult medical examination without abnormal findings: Secondary | ICD-10-CM

## 2023-01-22 DIAGNOSIS — E785 Hyperlipidemia, unspecified: Secondary | ICD-10-CM

## 2023-01-22 DIAGNOSIS — E1169 Type 2 diabetes mellitus with other specified complication: Secondary | ICD-10-CM

## 2023-01-22 NOTE — Patient Instructions (Signed)
Follow up in 3-4 months to recheck sugars °We'll notify you of your lab results and make any changes if needed °Continue to work on healthy diet and regular exercise- you can do it!! °Call with any questions or concerns °Stay Safe!  Stay Healthy!! °Happy Spring!!! °

## 2023-01-22 NOTE — Assessment & Plan Note (Signed)
Chronic problem, tolerating statin w/o difficulty.  Check labs.  Adjust meds prn  

## 2023-01-22 NOTE — Progress Notes (Signed)
   Subjective:    Patient ID: Jacob Crawford, male    DOB: 01/29/1956, 67 y.o.   MRN: [id number]  HPI CPE- UTD on colonoscopy, microalbumin, eye exam, Tdap, flu.  No concerns today  Patient Care Team    Relationship Specialty Notifications Start End  Midge Minium, MD PCP - General Family Medicine  12/20/12   Sable Feil, MD Consulting Physician Gastroenterology  09/05/15   Tanda Rockers, MD Consulting Physician Pulmonary Disease  09/05/15   Christy Sartorius, MD Referring Physician Urology  09/05/15      Health Maintenance  Topic Date Due   COLONOSCOPY (Pts 45-40yrs Insurance coverage will need to be confirmed)  06/23/2023 (Originally 02/17/2018)   Diabetic kidney evaluation - Urine ACR  03/17/2023   HEMOGLOBIN A1C  04/27/2023   OPHTHALMOLOGY EXAM  06/04/2023   Diabetic kidney evaluation - eGFR measurement  10/27/2023   FOOT EXAM  01/22/2024   DTaP/Tdap/Td (3 - Td or Tdap) 01/27/2028   INFLUENZA VACCINE  Completed   Hepatitis C Screening  Completed   HPV VACCINES  Aged Out   COVID-19 Vaccine  Discontinued   HIV Screening  Discontinued   Zoster Vaccines- Shingrix  Discontinued      Review of Systems Patient reports no vision/hearing changes, anorexia, fever ,adenopathy, persistant/recurrent hoarseness, swallowing issues, chest pain, palpitations, edema, persistant/recurrent cough, hemoptysis, dyspnea (rest,exertional, paroxysmal nocturnal), gastrointestinal  bleeding (melena, rectal bleeding), abdominal pain, excessive heart burn, GU symptoms (dysuria, hematuria, voiding/incontinence issues) syncope, focal weakness, memory loss, numbness & tingling, skin/hair/nail changes, depression, anxiety, abnormal bruising/bleeding, musculoskeletal symptoms/signs.   + 10 lb weight gain    Objective:   Physical Exam General Appearance:    Alert, cooperative, no distress, appears stated age, obese  Head:    Normocephalic, without obvious abnormality, atraumatic  Eyes:     PERRL, conjunctiva/corneas clear, EOM's intact both eyes       Ears:    Normal TM's and external ear canals, both ears  Nose:   Nares normal, septum midline, mucosa normal, no drainage   or sinus tenderness  Throat:   Lips, mucosa, and tongue normal; teeth and gums normal  Neck:   Supple, symmetrical, trachea midline, no adenopathy;       thyroid:  No enlargement/tenderness/nodules  Back:     Symmetric, no curvature, ROM normal, no CVA tenderness  Lungs:     Clear to auscultation bilaterally, respirations unlabored  Chest wall:    No tenderness or deformity  Heart:    Regular rate and rhythm, S1 and S2 normal, no murmur, rub   or gallop  Abdomen:     Soft, non-tender, bowel sounds active all four quadrants,    no masses, no organomegaly  Genitalia:    deferred  Rectal:    Extremities:   Extremities normal, atraumatic, no cyanosis or edema  Pulses:   2+ and symmetric all extremities  Skin:   Skin color, texture, turgor normal, no rashes or lesions  Lymph nodes:   Cervical, supraclavicular, and axillary nodes normal  Neurologic:   CNII-XII intact. Normal strength, sensation and reflexes      throughout          Assessment & Plan:

## 2023-01-22 NOTE — Assessment & Plan Note (Signed)
Ongoing issue for pt.  He has gained 10 lbs since last visit.  Encouraged low carb diet and regular exercise.  Check labs to risk stratify.  Will follow.

## 2023-01-22 NOTE — Assessment & Plan Note (Signed)
Pt's PE WNL w/ exception of BMI.  UTD on colonoscopy, microalbumin, eye exam, Tdap, flu.  Check labs.  Anticipatory guidance provided.

## 2023-01-25 ENCOUNTER — Telehealth: Payer: Self-pay

## 2023-01-25 LAB — BASIC METABOLIC PANEL
BUN: 15 mg/dL (ref 7–25)
CO2: 21 mmol/L (ref 20–32)
Calcium: 9.2 mg/dL (ref 8.6–10.3)
Chloride: 105 mmol/L (ref 98–110)
Creat: 0.84 mg/dL (ref 0.70–1.35)
Glucose, Bld: 76 mg/dL (ref 65–99)
Potassium: 3.8 mmol/L (ref 3.5–5.3)
Sodium: 142 mmol/L (ref 135–146)

## 2023-01-25 LAB — LIPID PANEL
Cholesterol: 102 mg/dL (ref <200)
HDL: 46 mg/dL (ref >=40)
LDL Cholesterol (Calc): 40 mg/dL (calc)
Non-HDL Cholesterol (Calc): 56 mg/dL (calc) (ref <130)
Total CHOL/HDL Ratio: 2.2 (calc) (ref <5.0)
Triglycerides: 76 mg/dL (ref <150)

## 2023-01-25 LAB — EXTRA SPECIMEN

## 2023-01-25 LAB — HEMOGLOBIN A1C
Hgb A1c MFr Bld: 5.5 % of total Hgb (ref <5.7)
Mean Plasma Glucose: 111 mg/dL
eAG (mmol/L): 6.2 mmol/L

## 2023-01-25 LAB — CBC WITH DIFFERENTIAL/PLATELET
Absolute Monocytes: 447 cells/uL (ref 200–950)
Basophils Absolute: 41 cells/uL (ref 0–200)
Basophils Relative: 0.7 %
Eosinophils Absolute: 157 cells/uL (ref 15–500)
Eosinophils Relative: 2.7 %
HCT: 49.1 % (ref 38.5–50.0)
Hemoglobin: 17.2 g/dL — ABNORMAL HIGH (ref 13.2–17.1)
Lymphs Abs: 1636 cells/uL (ref 850–3900)
MCH: 31.3 pg (ref 27.0–33.0)
MCHC: 35 g/dL (ref 32.0–36.0)
MCV: 89.4 fL (ref 80.0–100.0)
MPV: 9.8 fL (ref 7.5–12.5)
Monocytes Relative: 7.7 %
Neutro Abs: 3521 cells/uL (ref 1500–7800)
Neutrophils Relative %: 60.7 %
Platelets: 226 10*3/uL (ref 140–400)
RBC: 5.49 10*6/uL (ref 4.20–5.80)
RDW: 12.5 % (ref 11.0–15.0)
Total Lymphocyte: 28.2 %
WBC: 5.8 10*3/uL (ref 3.8–10.8)

## 2023-01-25 LAB — HEPATIC FUNCTION PANEL
AG Ratio: 1.6 (calc) (ref 1.0–2.5)
ALT: 25 U/L (ref 9–46)
AST: 21 U/L (ref 10–35)
Albumin: 4.5 g/dL (ref 3.6–5.1)
Alkaline phosphatase (APISO): 62 U/L (ref 35–144)
Bilirubin, Direct: 0.2 mg/dL (ref 0.0–0.2)
Globulin: 2.9 g/dL (calc) (ref 1.9–3.7)
Indirect Bilirubin: 0.5 mg/dL (calc) (ref 0.2–1.2)
Total Bilirubin: 0.7 mg/dL (ref 0.2–1.2)
Total Protein: 7.4 g/dL (ref 6.1–8.1)

## 2023-01-25 LAB — PSA: PSA: 1.93 ng/mL (ref <=4.00)

## 2023-01-25 LAB — TSH: TSH: 2.85 mIU/L (ref 0.40–4.50)

## 2023-01-25 LAB — MICROALBUMIN / CREATININE URINE RATIO

## 2023-01-25 NOTE — Telephone Encounter (Signed)
Pt seen results Via my chart  

## 2023-01-25 NOTE — Telephone Encounter (Signed)
-----   Message from Midge Minium, MD sent at 01/24/2023  9:34 AM EDT ----- Labs look great!  No changes at this time

## 2023-01-29 IMAGING — CT CT CARDIAC CORONARY ARTERY CALCIUM SCORE
3 series · 14 of 20 positions shown, 16 images · non-contrast
Comparison: None.

Addendum:
CLINICAL DATA: Cardiovascular Disease Risk stratification

EXAM:
Coronary Calcium Score
TECHNIQUE: A gated, non-contrast computed tomography scan of the heart was
performed using 3mm slice thickness. Axial images were analyzed on a
dedicated workstation. Calcium scoring of the coronary arteries was
performed using the Agatston method.

[Series 2: cascseq 2.0 sa36 (id) (id) · axial · 0.43mm/px · z∈[-281,-201]mm · 4 of 68 slices shown]
[im 14/68  vessel]
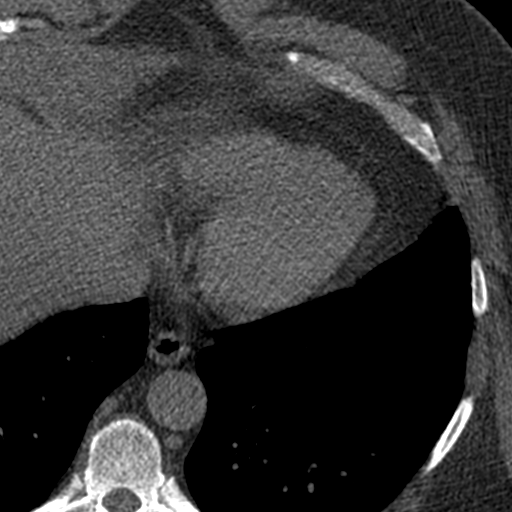
[im 27/68  vessel]
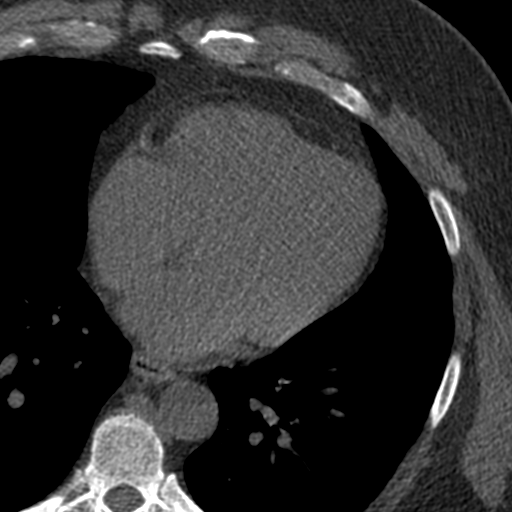
[im 41/68  vessel]
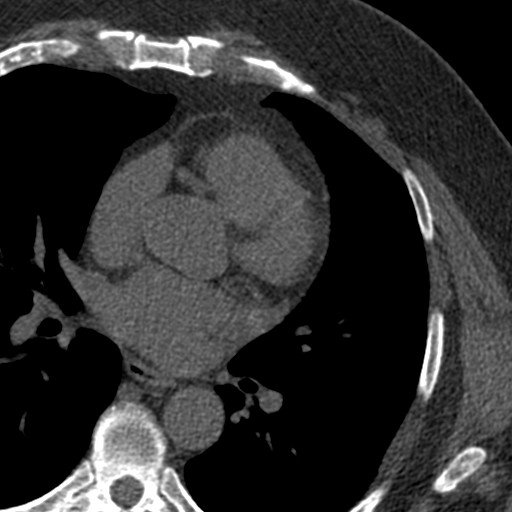
[im 54/68  vessel]
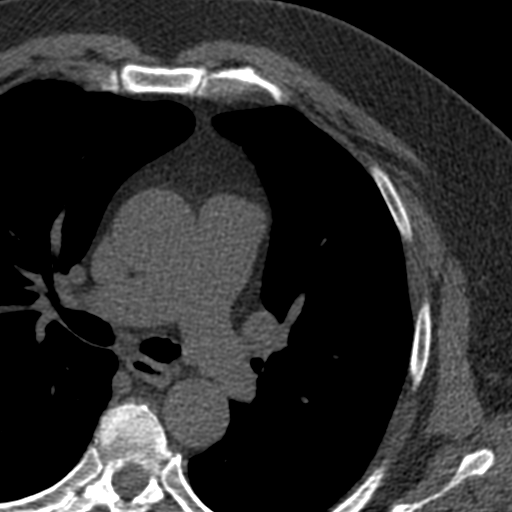

[Series 3: cascseq 2.0 bf37 st · axial · 0.76mm/px · z∈[-285,-197]mm · 5 of 68 slices shown, 7 images]
[im 12/68  vessel]
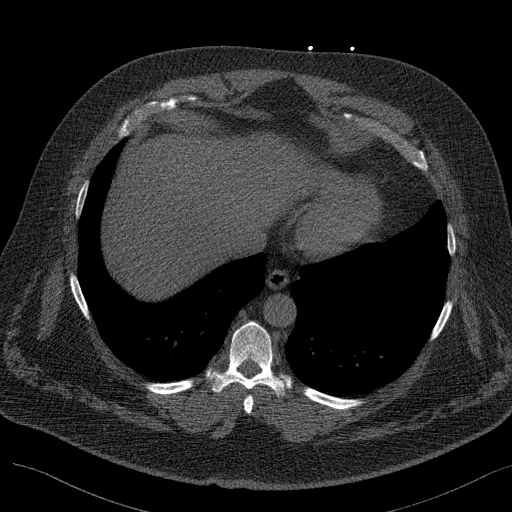
[im 12/68  lung]
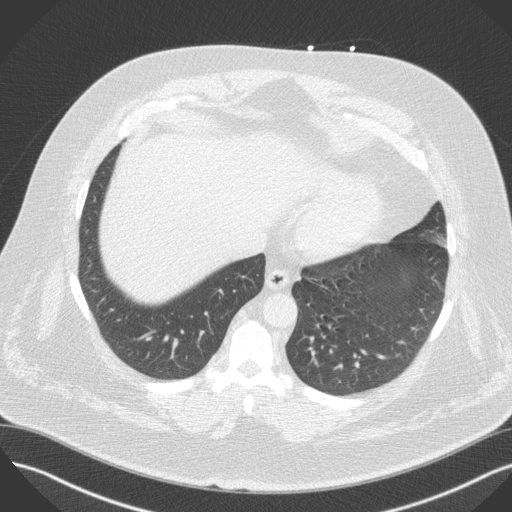
[im 23/68  vessel]
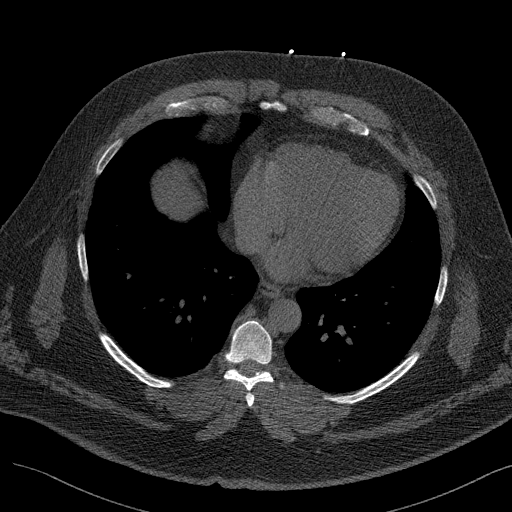
[im 34/68  vessel]
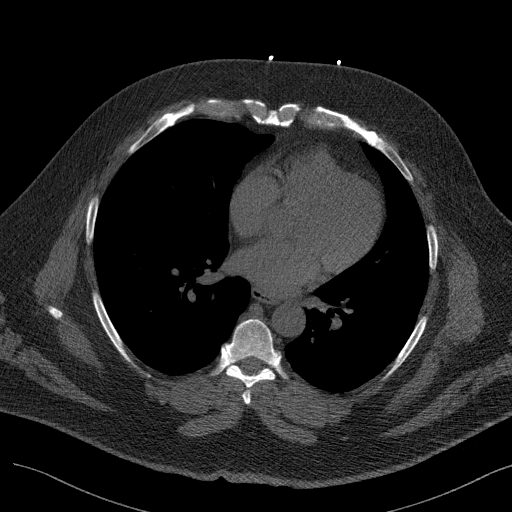
[im 45/68  vessel]
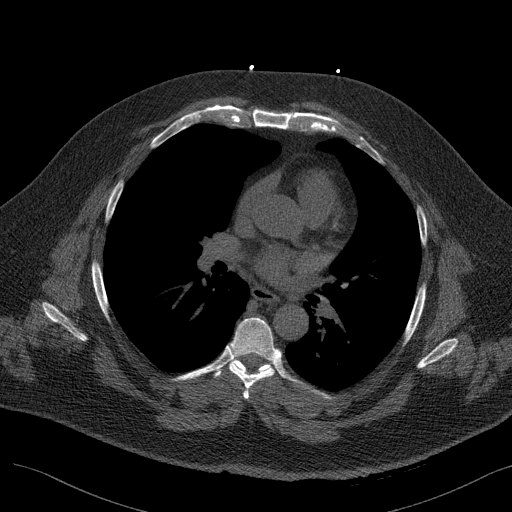
[im 56/68  vessel]
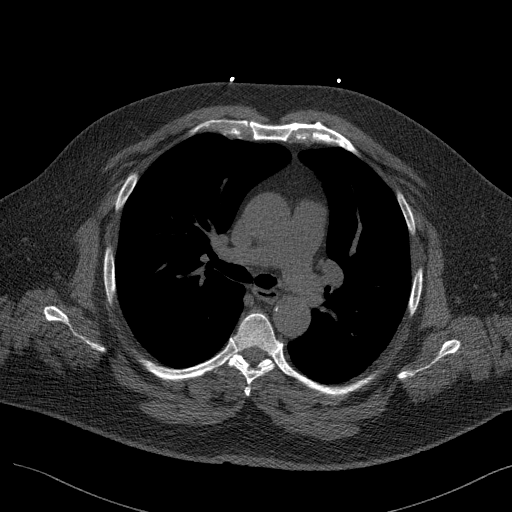
[im 56/68  lung]
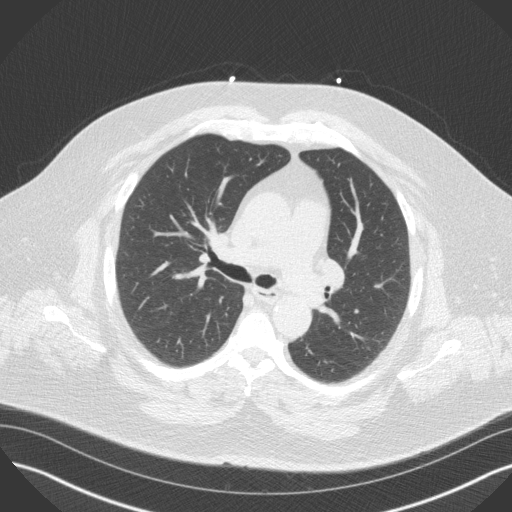

[Series 4: cascseq 2.0 br59 lung · axial · 0.76mm/px · z∈[-285,-197]mm · 5 of 68 slices shown]
[im 12/68  lung]
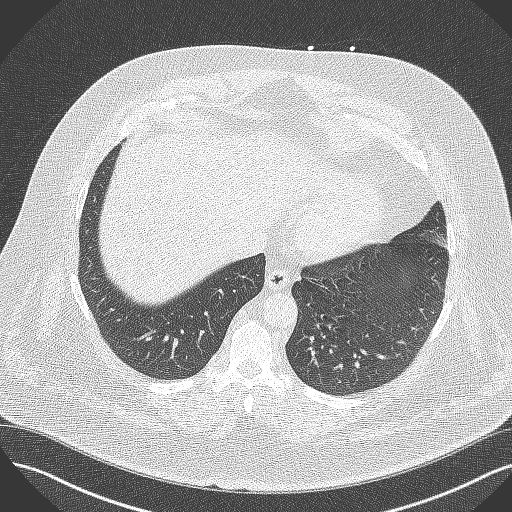
[im 23/68  lung]
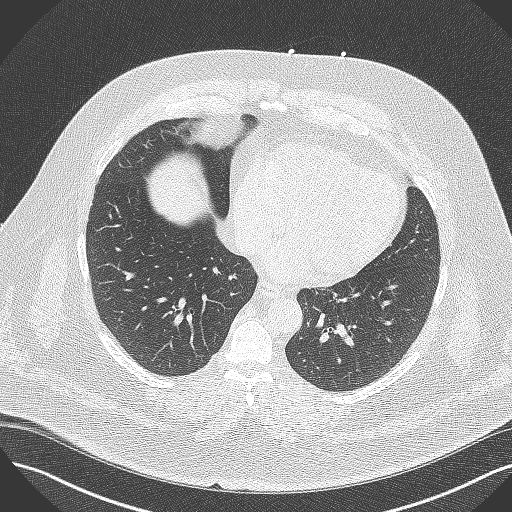
[im 34/68  lung]
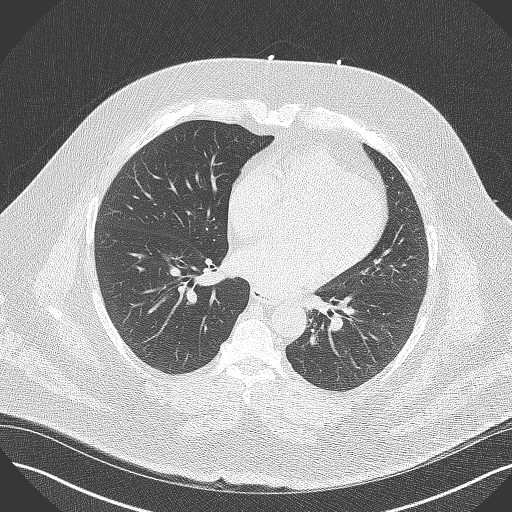
[im 45/68  lung]
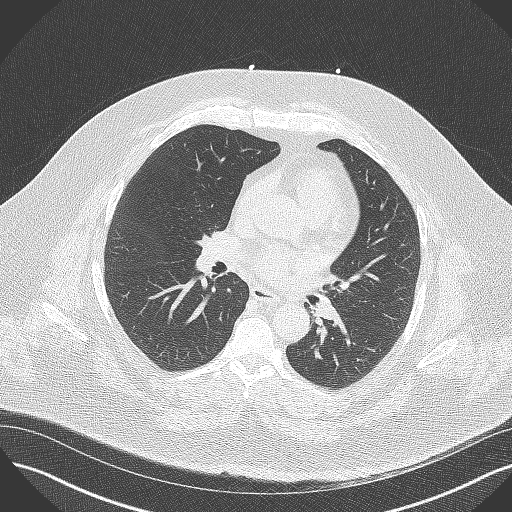
[im 56/68  lung]
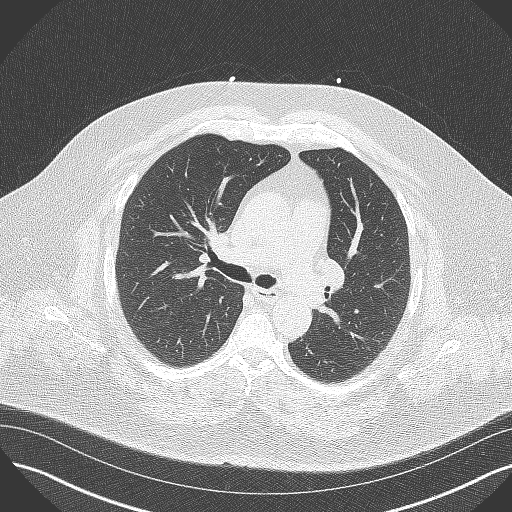

[14 of 20 positions shown; findings below may reference images not displayed]

FINDINGS: Coronary Calcium Score:

Left main: 0

Left anterior descending artery:

Left circumflex artery: 0

Right coronary artery: 0

Total:

Percentile: 42

Pericardium: Normal.

Ascending Aorta: Normal caliber.  Aortic atherosclerosis.

Non-cardiac: See separate report from [REDACTED].
IMPRESSION: Coronary calcium score of 28.7. This was 42 percentile for age-,
race-, and sex-matched controls.

Aortic atherosclerosis.



If CAC=0, it is reasonable to withhold statin therapy and reassess
in 5 to 10 years, as long as higher risk conditions are absent
(diabetes mellitus, family history of premature CHD in first degree
relatives (males <55 years; females <65 years), cigarette smoking,
or LDL >=190 mg/dL).

If CAC is 1 to 99, it is reasonable to initiate statin therapy for
patients >=55 years of age.

If CAC is >=100 or >=75th percentile, it is reasonable to initiate
statin therapy at any age.

Cardiology referral should be considered for patients with CAC
scores >=400 or >=75th percentile.

*2535 AHA/ACC/AACVPR/AAPA/ABC/POUYE/GABONE/TIGER/[place]/GYLIENE/DODDS/JUMPER
Guideline on the Management of Blood Cholesterol: A Report of the
American College of Cardiology/American Heart Association Task Force
on Clinical Practice Guidelines. J Am Coll Cardiol.
6917;73(24):8925-8051.

EXAM:
OVER-READ INTERPRETATION  CT CHEST

The following report is an over-read performed by radiologist Dr.
over-read does not include interpretation of cardiac or coronary
anatomy or pathology. The interpretation by the cardiologist is
attached.
FINDINGS: Limited view of the lung parenchyma demonstrates no suspicious
nodularity. Airways are normal.

Limited view of the mediastinum demonstrates no adenopathy.
Esophagus normal.

Limited view of the upper abdomen unremarkable.

Limited view of the skeleton and chest wall is unremarkable.
IMPRESSION: No significant extracardiac findings.

*** End of Addendum ***
FINDINGS: Coronary Calcium Score:

Left main: 0

Left anterior descending artery:

Left circumflex artery: 0

Right coronary artery: 0

Total:

Percentile: 42

Pericardium: Normal.

Ascending Aorta: Normal caliber.  Aortic atherosclerosis.

Non-cardiac: See separate report from [REDACTED].
IMPRESSION: Coronary calcium score of 28.7. This was 42 percentile for age-,
race-, and sex-matched controls.

Aortic atherosclerosis.



If CAC=0, it is reasonable to withhold statin therapy and reassess
in 5 to 10 years, as long as higher risk conditions are absent
(diabetes mellitus, family history of premature CHD in first degree
relatives (males <55 years; females <65 years), cigarette smoking,
or LDL >=190 mg/dL).

If CAC is 1 to 99, it is reasonable to initiate statin therapy for
patients >=55 years of age.

If CAC is >=100 or >=75th percentile, it is reasonable to initiate
statin therapy at any age.

Cardiology referral should be considered for patients with CAC
scores >=400 or >=75th percentile.

*2535 AHA/ACC/AACVPR/AAPA/ABC/POUYE/GABONE/TIGER/[place]/GYLIENE/DODDS/JUMPER
Guideline on the Management of Blood Cholesterol: A Report of the
American College of Cardiology/American Heart Association Task Force
on Clinical Practice Guidelines. J Am Coll Cardiol.
6917;73(24):8925-8051.

## 2023-02-08 ENCOUNTER — Other Ambulatory Visit: Payer: Self-pay | Admitting: Family Medicine

## 2023-02-13 ENCOUNTER — Other Ambulatory Visit: Payer: Self-pay | Admitting: Family Medicine

## 2023-02-13 DIAGNOSIS — R059 Cough, unspecified: Secondary | ICD-10-CM

## 2023-03-05 ENCOUNTER — Other Ambulatory Visit: Payer: Self-pay | Admitting: Family Medicine

## 2023-03-09 ENCOUNTER — Other Ambulatory Visit: Payer: Self-pay | Admitting: Family Medicine

## 2023-04-01 ENCOUNTER — Other Ambulatory Visit: Payer: Self-pay | Admitting: Family Medicine

## 2023-04-06 ENCOUNTER — Encounter: Payer: Self-pay | Admitting: Family Medicine

## 2023-04-11 ENCOUNTER — Other Ambulatory Visit: Payer: Self-pay | Admitting: Family Medicine

## 2023-05-15 ENCOUNTER — Other Ambulatory Visit: Payer: Self-pay | Admitting: Family Medicine

## 2023-05-24 ENCOUNTER — Encounter: Payer: Self-pay | Admitting: Family Medicine

## 2023-05-24 ENCOUNTER — Ambulatory Visit: Payer: Commercial Managed Care - PPO | Admitting: Family Medicine

## 2023-05-24 VITALS — BP 130/78 | HR 62 | Temp 97.9°F | Resp 18 | Ht 63.0 in | Wt 215.1 lb

## 2023-05-24 DIAGNOSIS — E119 Type 2 diabetes mellitus without complications: Secondary | ICD-10-CM

## 2023-05-24 LAB — BASIC METABOLIC PANEL
BUN: 23 mg/dL (ref 6–23)
CO2: 25 mEq/L (ref 19–32)
Calcium: 9.4 mg/dL (ref 8.4–10.5)
Chloride: 108 mEq/L (ref 96–112)
Creatinine, Ser: 0.89 mg/dL (ref 0.40–1.50)
GFR: 90.53 mL/min (ref >60.00)
Glucose, Bld: 91 mg/dL (ref 70–99)
Potassium: 4.1 mEq/L (ref 3.5–5.1)
Sodium: 140 mEq/L (ref 135–145)

## 2023-05-24 LAB — HEMOGLOBIN A1C: Hgb A1c MFr Bld: 5.1 % (ref 4.6–6.5)

## 2023-05-24 NOTE — Assessment & Plan Note (Signed)
Pt is down 37 lbs since March!  Applauded his efforts and encouraged him to continue.  Will follow.

## 2023-05-24 NOTE — Progress Notes (Signed)
   Subjective:    Patient ID: Jacob Crawford, male    DOB: [birth year], 67 y.o.   MRN: [id number]  HPI DM- ongoing issue for pt.  Last A1C 5.5%  UTD on eye exam, foot exam.  Due for microalbumin.  Pt is down 37 lbs since last visit.  Pt reports feeling good.  No CP, SOB, HA's, visual changes, abd pain, N/V.  No numbness/tingling of hands/feet.  Obesity- pt is down 37 lbs since last visit.  Pt is going to [facility] and has been on their program since April.     Review of Systems For ROS see HPI     Objective:   Physical Exam Vitals reviewed.  Constitutional:      General: He is not in acute distress.    Appearance: Normal appearance. He is well-developed. He is obese. He is not ill-appearing.  HENT:     Head: Normocephalic and atraumatic.  Eyes:     Extraocular Movements: Extraocular movements intact.     Conjunctiva/sclera: Conjunctivae normal.     Pupils: Pupils are equal, round, and reactive to light.  Neck:     Thyroid: No thyromegaly.  Cardiovascular:     Rate and Rhythm: Normal rate and regular rhythm.     Pulses: Normal pulses.     Heart sounds: Normal heart sounds. No murmur heard. Pulmonary:     Effort: Pulmonary effort is normal. No respiratory distress.     Breath sounds: Normal breath sounds.  Abdominal:     General: Bowel sounds are normal. There is no distension.     Palpations: Abdomen is soft.  Musculoskeletal:     Cervical back: Normal range of motion and neck supple.     Right lower leg: No edema.     Left lower leg: No edema.  Lymphadenopathy:     Cervical: No cervical adenopathy.  Skin:    General: Skin is warm and dry.  Neurological:     General: No focal deficit present.     Mental Status: He is alert and oriented to person, place, and time.     Cranial Nerves: No cranial nerve deficit.  Psychiatric:        Mood and Affect: Mood normal.        Behavior: Behavior normal.           Assessment & Plan:

## 2023-05-24 NOTE — Assessment & Plan Note (Signed)
Ongoing issue.  Hx of excellent control w/ A1C of 5.5%.  UTD on eye exam (due the end of the month), foot exam.  Microalbumin ordered.  Applauded his efforts at weight loss.  Check labs.  Adjust plans prn

## 2023-05-24 NOTE — Patient Instructions (Signed)
Follow up in 3-4 months to recheck BP, sugar, cholesterol We'll notify you of your lab results and make any changes if needed Keep up the good work on healthy diet and regular exercise- you're doing great! Your eye exam is due the end of the month Call with any questions or concerns Stay Safe!  Stay Healthy! Have a great summer!!

## 2023-07-02 ENCOUNTER — Other Ambulatory Visit: Payer: Self-pay | Admitting: Family Medicine

## 2023-07-29 ENCOUNTER — Encounter: Payer: Self-pay | Admitting: Family Medicine

## 2023-07-29 MED ORDER — AMLODIPINE BESYLATE 5 MG PO TABS: 5.0000 mg | ORAL_TABLET | Freq: Every day | ORAL | 1 refills | Status: DC

## 2023-07-29 NOTE — Telephone Encounter (Signed)
Patient is requesting dose on his amlodipine he stated this was discussed last visit it but I could not find this notated please advise if this is acceptable.

## 2023-07-30 ENCOUNTER — Other Ambulatory Visit: Payer: Self-pay | Admitting: Family Medicine

## 2023-08-17 ENCOUNTER — Other Ambulatory Visit: Payer: Self-pay | Admitting: Family Medicine

## 2023-08-20 ENCOUNTER — Encounter: Payer: Self-pay | Admitting: Family Medicine

## 2023-08-20 ENCOUNTER — Ambulatory Visit (INDEPENDENT_AMBULATORY_CARE_PROVIDER_SITE_OTHER): Payer: Commercial Managed Care - PPO | Admitting: Family Medicine

## 2023-08-20 VITALS — BP 122/74 | HR 65 | Temp 97.8°F | Ht 63.0 in | Wt 197.8 lb

## 2023-08-20 DIAGNOSIS — I10 Essential (primary) hypertension: Secondary | ICD-10-CM

## 2023-08-20 DIAGNOSIS — E1169 Type 2 diabetes mellitus with other specified complication: Secondary | ICD-10-CM

## 2023-08-20 DIAGNOSIS — Z6835 Body mass index (BMI) 35.0-35.9, adult: Secondary | ICD-10-CM

## 2023-08-20 DIAGNOSIS — E119 Type 2 diabetes mellitus without complications: Secondary | ICD-10-CM

## 2023-08-20 DIAGNOSIS — E785 Hyperlipidemia, unspecified: Secondary | ICD-10-CM

## 2023-08-20 DIAGNOSIS — Z125 Encounter for screening for malignant neoplasm of prostate: Secondary | ICD-10-CM

## 2023-08-20 NOTE — Progress Notes (Signed)
   Subjective:    Patient ID: MELQUISEDEC JOURNEY, male    DOB: [birth year], 67 y.o.   MRN: [id number]  HPI DM- chronic problem.  Currently diet controlled.  UTD on foot exam.  Due for eye exam and microalbumin.  No numbness/tingling of hands/feet.  Waiting on eye exam until Medicare (next year).  Obesity- down 18 lbs since July.  Currently seeing [facility].  HTN- chronic problem, on Amlodipine 5mg  daily, Valsartan 320mg  daily w/ good control.  CP, SOB, HA's, visual changes, edema.  Hyperlipidemia- chronic problem, on Crestor 5mg  daily.  No abd pain, N/V.   Review of Systems For ROS see HPI     Objective:   Physical Exam Vitals reviewed.  Constitutional:      General: He is not in acute distress.    Appearance: Normal appearance. He is well-developed. He is obese.  HENT:     Head: Normocephalic and atraumatic.  Eyes:     Extraocular Movements: Extraocular movements intact.     Conjunctiva/sclera: Conjunctivae normal.     Pupils: Pupils are equal, round, and reactive to light.  Neck:     Thyroid: No thyromegaly.  Cardiovascular:     Rate and Rhythm: Normal rate and regular rhythm.     Pulses: Normal pulses.     Heart sounds: Normal heart sounds. No murmur heard. Pulmonary:     Effort: Pulmonary effort is normal. No respiratory distress.     Breath sounds: Normal breath sounds.  Abdominal:     General: Bowel sounds are normal. There is no distension.     Palpations: Abdomen is soft.  Musculoskeletal:     Cervical back: Normal range of motion and neck supple.     Right lower leg: No edema.     Left lower leg: No edema.  Lymphadenopathy:     Cervical: No cervical adenopathy.  Skin:    General: Skin is warm and dry.  Neurological:     General: No focal deficit present.     Mental Status: He is alert and oriented to person, place, and time.     Cranial Nerves: No cranial nerve deficit.  Psychiatric:        Mood and Affect: Mood normal.        Behavior: Behavior  normal.           Assessment & Plan:

## 2023-08-20 NOTE — Patient Instructions (Signed)
Schedule your complete physical in 6 months We'll notify you of your lab results and make any changes if needed Keep up the good work on healthy diet and regular exercise- you look great!! Call with any questions or concerns Stay Safe!  Stay Healthy! Happy Fall!! 

## 2023-08-24 ENCOUNTER — Telehealth: Payer: Self-pay

## 2023-08-24 NOTE — Assessment & Plan Note (Signed)
Chronic problem.  Currently diet controlled.  Down another 18 lbs since July.  UTD on foot exam.  Microalbumin ordered.  Pt waiting on eye exam until he turns 67.  Currently asymptomatic.  Check labs and determine if medication is needed.

## 2023-08-24 NOTE — Assessment & Plan Note (Signed)
Pt is down another 18 lbs.  Currently seeing [facility].  Reports feeling good.  Applauded his efforts.  Check labs to risk stratify.  Will follow.

## 2023-08-24 NOTE — Telephone Encounter (Signed)
Patient is aware of labs and had no questions at this time.

## 2023-08-24 NOTE — Assessment & Plan Note (Signed)
Chronic problem.  Currently on Amlodipine and Valsartan daily w/ good control.  Currently asymptomatic.  Check labs due to ARB but no anticipated med changes.

## 2023-08-24 NOTE — Telephone Encounter (Signed)
-----   Message from Neena Rhymes sent at 08/24/2023  7:22 AM EDT ----- Labs look great!  No changes at this time.  Your ALT (liver enzyme) is just slightly out of range but no cause for concern.  We will continue to monitor at future visits

## 2023-08-24 NOTE — Assessment & Plan Note (Signed)
Chronic problem.  Currently on Crestor 5mg  daily w/o difficulty.  Check labs.  Adjust meds prn

## 2023-08-25 LAB — BASIC METABOLIC PANEL
BUN: 14 mg/dL (ref 7–25)
CO2: 24 mmol/L (ref 20–32)
Calcium: 9.7 mg/dL (ref 8.6–10.3)
Chloride: 105 mmol/L (ref 98–110)
Creat: 0.92 mg/dL (ref 0.70–1.35)
Glucose, Bld: 88 mg/dL (ref 65–99)
Potassium: 4.3 mmol/L (ref 3.5–5.3)
Sodium: 143 mmol/L (ref 135–146)

## 2023-08-25 LAB — LIPID PANEL
Cholesterol: 99 mg/dL (ref <200)
HDL: 42 mg/dL (ref >=40)
LDL Cholesterol (Calc): 42 mg/dL
Non-HDL Cholesterol (Calc): 57 mg/dL (ref <130)
Total CHOL/HDL Ratio: 2.4 (calc) (ref <5.0)
Triglycerides: 70 mg/dL (ref <150)

## 2023-08-25 LAB — CBC WITH DIFFERENTIAL/PLATELET
Absolute Monocytes: 426 {cells}/uL (ref 200–950)
Basophils Absolute: 39 {cells}/uL (ref 0–200)
Basophils Relative: 0.7 %
Eosinophils Absolute: 101 {cells}/uL (ref 15–500)
Eosinophils Relative: 1.8 %
HCT: 50.1 % — ABNORMAL HIGH (ref 38.5–50.0)
Hemoglobin: 16.9 g/dL (ref 13.2–17.1)
Lymphs Abs: 1361 {cells}/uL (ref 850–3900)
MCH: 30.2 pg (ref 27.0–33.0)
MCHC: 33.7 g/dL (ref 32.0–36.0)
MCV: 89.6 fL (ref 80.0–100.0)
MPV: 10.4 fL (ref 7.5–12.5)
Monocytes Relative: 7.6 %
Neutro Abs: 3674 {cells}/uL (ref 1500–7800)
Neutrophils Relative %: 65.6 %
Platelets: 200 10*3/uL (ref 140–400)
RBC: 5.59 10*6/uL (ref 4.20–5.80)
RDW: 12.3 % (ref 11.0–15.0)
Total Lymphocyte: 24.3 %
WBC: 5.6 10*3/uL (ref 3.8–10.8)

## 2023-08-25 LAB — TSH: TSH: 3.19 m[IU]/L (ref 0.40–4.50)

## 2023-08-25 LAB — HEMOGLOBIN A1C
Hgb A1c MFr Bld: 5.3 %{Hb} (ref <5.7)
Mean Plasma Glucose: 105 mg/dL
eAG (mmol/L): 5.8 mmol/L

## 2023-08-25 LAB — HEPATIC FUNCTION PANEL
AG Ratio: 1.8 (calc) (ref 1.0–2.5)
ALT: 48 U/L — ABNORMAL HIGH (ref 9–46)
AST: 26 U/L (ref 10–35)
Albumin: 4.6 g/dL (ref 3.6–5.1)
Alkaline phosphatase (APISO): 87 U/L (ref 35–144)
Bilirubin, Direct: 0.2 mg/dL (ref 0.0–0.2)
Globulin: 2.6 g/dL (ref 1.9–3.7)
Indirect Bilirubin: 0.6 mg/dL (ref 0.2–1.2)
Total Bilirubin: 0.8 mg/dL (ref 0.2–1.2)
Total Protein: 7.2 g/dL (ref 6.1–8.1)

## 2023-08-25 LAB — TEST AUTHORIZATION

## 2023-08-25 LAB — PSA: PSA: 2.13 ng/mL (ref <=4.00)

## 2023-09-17 ENCOUNTER — Other Ambulatory Visit: Payer: Self-pay | Admitting: Family Medicine

## 2023-10-14 ENCOUNTER — Other Ambulatory Visit: Payer: Self-pay | Admitting: Family Medicine

## 2023-11-18 ENCOUNTER — Encounter: Payer: Self-pay | Admitting: Family Medicine

## 2023-11-18 ENCOUNTER — Encounter: Payer: Self-pay | Admitting: Cardiology

## 2023-11-18 MED ORDER — AMLODIPINE BESYLATE 5 MG PO TABS: 5.0000 mg | ORAL_TABLET | Freq: Every day | ORAL | 1 refills | Status: DC

## 2023-11-18 MED ORDER — SILDENAFIL CITRATE 100 MG PO TABS: ORAL_TABLET | ORAL | 0 refills | Status: DC

## 2023-11-18 MED ORDER — ROSUVASTATIN CALCIUM 5 MG PO TABS: 5.0000 mg | ORAL_TABLET | Freq: Every day | ORAL | 0 refills | Status: DC

## 2023-11-18 MED ORDER — VALSARTAN 320 MG PO TABS: 320.0000 mg | ORAL_TABLET | Freq: Every day | ORAL | 0 refills | Status: DC

## 2023-11-18 MED ORDER — BUPROPION HCL ER (XL) 150 MG PO TB24: 150.0000 mg | ORAL_TABLET | Freq: Every day | ORAL | 0 refills | Status: DC

## 2023-12-22 ENCOUNTER — Other Ambulatory Visit: Payer: Self-pay | Admitting: Family Medicine

## 2023-12-24 ENCOUNTER — Ambulatory Visit: Payer: Medicare Other | Attending: Cardiology | Admitting: Cardiology

## 2023-12-24 ENCOUNTER — Encounter: Payer: Self-pay | Admitting: Cardiology

## 2023-12-24 ENCOUNTER — Ambulatory Visit: Payer: Medicare Other | Admitting: Cardiology

## 2023-12-24 VITALS — BP 126/64 | HR 67 | Ht 63.0 in | Wt 201.0 lb

## 2023-12-24 DIAGNOSIS — E785 Hyperlipidemia, unspecified: Secondary | ICD-10-CM | POA: Diagnosis not present

## 2023-12-24 DIAGNOSIS — I10 Essential (primary) hypertension: Secondary | ICD-10-CM | POA: Diagnosis not present

## 2023-12-24 DIAGNOSIS — G4733 Obstructive sleep apnea (adult) (pediatric): Secondary | ICD-10-CM

## 2023-12-24 DIAGNOSIS — E1169 Type 2 diabetes mellitus with other specified complication: Secondary | ICD-10-CM

## 2023-12-24 DIAGNOSIS — I2510 Atherosclerotic heart disease of native coronary artery without angina pectoris: Secondary | ICD-10-CM | POA: Diagnosis not present

## 2023-12-24 DIAGNOSIS — E119 Type 2 diabetes mellitus without complications: Secondary | ICD-10-CM | POA: Diagnosis not present

## 2023-12-24 MED ORDER — ASPIRIN 81 MG PO TBEC: 81.0000 mg | DELAYED_RELEASE_TABLET | Freq: Every day | ORAL | 3 refills | Status: AC

## 2023-12-24 NOTE — Progress Notes (Signed)
Cardiology Office Note:    Date:  12/24/2023   ID:  Jacob Crawford, DOB 09/20/1956, MRN [id number]  PCP:  Sheliah Hatch, MD  Cardiologist:  Garwin Brothers, MD   Referring MD: Sheliah Hatch, MD    ASSESSMENT:    1. Hyperlipidemia associated with type 2 diabetes mellitus (HCC)   2. Essential hypertension   3. OSA (obstructive sleep apnea)   4. Diabetes mellitus without complication (HCC)   5. Atherosclerosis of native coronary artery of native heart without angina pectoris    PLAN:    In order of problems listed above:  Coronary atherosclerosis: Secondary prevention stressed with the patient.  Importance of compliance with medication stressed and he vocalized understanding.  He was advised to walk at least 1 hour a day 5 days a week and he promises to do so. Essential hypertension: Blood pressure stable and diet was emphasized.  Lifestyle modification urged. Mixed dyslipidemia: On lipid-lowering medications followed by.  Lipids reviewed and discussed with him at length Obstructive sleep apnea: Sleep health issues were discussed. Obesity: Weight reduction stressed and he has done really well with this initiated. Patient will be seen in follow-up appointment in 6 months or earlier if the patient has any concerns.    Medication Adjustments/Labs and Tests Ordered: Current medicines are reviewed at length with the patient today.  Concerns regarding medicines are outlined above.  Orders Placed This Encounter  Procedures   EKG 12-Lead   No orders of the defined types were placed in this encounter.    No chief complaint on file.    History of Present Illness:    Jacob Crawford is a 68 y.o. male.  Patient has past medical history of elevated calcium score, essential hypertension, mixed dyslipidemia, sleep apnea and obesity.  He tells me that he is now with the weight loss clinic and doing diet and exercise very meticulously and has lost weight.  He walks on a  regular basis.  No chest pain orthopnea or PND.  At the time of my evaluation, the patient is alert awake oriented and in no distress.  Past Medical History:  Diagnosis Date   Abdominal pain, epigastric 11/01/2013   Allergic rhinitis 03/01/2017   Anxiety and depression 02/07/2018   Diabetes mellitus without complication (HCC)    Erectile dysfunction 11/11/2021   Essential hypertension 12/20/2012   Changed losartan to valsartan due to cough  06/04/2015 >>>    H/O seasonal allergies    Hematuria 05/20/2015   History of 2019 novel coronavirus disease (COVID-19) 09/29/2019   Hyperlipidemia associated with type 2 diabetes mellitus (HCC) 06/22/2022   Lipoma of back 12/20/2012   Meralgia paresthetica of left side 12/20/2012   Morbid obesity (HCC) 12/20/2012   OSA (obstructive sleep apnea) 03/30/2013   Routine general medical examination at a health care facility 12/27/2012   Seasonal allergic rhinitis due to pollen 03/01/2017   Skin lesion of face 09/05/2015   Solitary pulmonary nodule 06/04/2015   CT abd 05/20/15 = 4 mm RLL  - CT chest 05/19/16 Basilar nodules seen on the previous abdomen and pelvis CT are stable in the interval, consistent with benign disease. Patient also has a 7 mm perifissural nodule in the right lung which was present previously but incompletely visualized, most likely representing subpleural lymph node > no f/u needed    Upper airway cough syndrome 06/04/2015   Followed in Pulmonary clinic/ [facility]/ [facility] - prev h/o ACEi cough  - trial off losartan  and max gerd rx 06/04/2015 >>>      Past Surgical History:  Procedure Laterality Date   FRACTURE SURGERY  1965   skull    Current Medications: Current Meds  Medication Sig   amLODipine (NORVASC) 5 MG tablet Take 1 tablet (5 mg total) by mouth daily.   Aspirin-Acetaminophen-Caffeine (GOODY HEADACHE PO) Take 1 packet by mouth once as needed for pain or headache.      buPROPion (WELLBUTRIN XL) 150 MG 24 hr tablet  Take 1 tablet (150 mg total) by mouth daily.   Cetirizine HCl (ZYRTEC PO) Take 1 tablet by mouth daily.   glucose blood (ONETOUCH VERIO) test strip USE AS INSTRUCTED TO TEST SUGARS 1 TO 2 TIMES DAILY   Lancets (ONETOUCH ULTRASOFT) lancets Use as instructed to test sugars 1-2 times daily. Dx E11.9   rosuvastatin (CRESTOR) 5 MG tablet Take 1 tablet (5 mg total) by mouth daily.   sildenafil (VIAGRA) 100 MG tablet TAKE 1/2 TO 1 (ONE-HALF TO ONE) TABLET BY MOUTH ONCE DAILY AS NEEDED FOR ERECTILE DYSFUNCTION   Triamcinolone Acetonide (NASACORT ALLERGY 24HR NA) Place 1-2 sprays into the nose as needed for rhinitis or allergies.   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     Employer: [employer]    Comment: ended in march 2018   Occupation: Superintendant, Oceanographer  Tobacco Use   Smoking status: Never   Smokeless tobacco: Never  Vaping Use   Vaping status: Never Used  Substance and Sexual Activity   Alcohol use: No   Drug use: No   Sexual activity: Not on file  Other Topics Concern   Not on file  Social History Narrative   Not on file   Social Drivers of Health   Financial Resource Strain: Not on file  Food Insecurity: Low Risk  (09/04/2023)   Received from [facility]   Hunger Vital Sign    Worried About Running Out of Food in the Last Year: Never true    Ran Out of Food in the Last Year: Never true  Transportation Needs: No Transportation Needs (09/04/2023)   Received from [facility]    In the past 12 months, has lack of reliable transportation kept you from medical appointments,  meetings, work or from getting things needed for daily living? : No  Physical Activity: Not on file  Stress: Not on file  Social Connections: Not on file     Family History: The patient's family history includes AAA (abdominal aortic aneurysm) in his father; Arthritis in his maternal grandfather, maternal grandmother, paternal grandfather, and paternal grandmother; COPD in his father; Cancer in his father; Colon cancer in his paternal grandfather; Heart disease in his cousin, father, and maternal grandfather; Hypertension in his father and mother; Mental illness (age of onset: 51) in his son; Sleep apnea in his mother; Stroke in his mother.  ROS:   Please see the history of present illness.    All other systems reviewed and are negative.  EKGs/Labs/Other Studies Reviewed:    The following studies were reviewed  today: I discussed my findings with the patient at length.   Recent Labs: 08/20/2023: ALT 48; BUN 14; Creat 0.92; Hemoglobin 16.9; Platelets 200; Potassium 4.3; Sodium 143; TSH 3.19  Recent Lipid Panel    Component Value Date/Time   CHOL 99 08/20/2023 1423   CHOL 89 (L) 10/08/2022 0833   TRIG 70 08/20/2023 1423   HDL 42 08/20/2023 1423   HDL 37 (L) 10/08/2022 0833   CHOLHDL 2.4 08/20/2023 1423   VLDL 11.6 06/22/2022 0811   LDLCALC 42 08/20/2023 1423    Physical Exam:    VS:  BP 126/64   Pulse 67   Ht 5\' 3"  (1.6 m)   Wt 201 lb (91.2 kg)   SpO2 95%   BMI 35.61 kg/m     Wt Readings from Last 3 Encounters:  12/24/23 201 lb (91.2 kg)  08/20/23 197 lb 12.8 oz (89.7 kg)  05/24/23 215 lb 2 oz (97.6 kg)     GEN: Patient is in no acute distress HEENT: Normal NECK: No JVD; No carotid bruits LYMPHATICS: No lymphadenopathy CARDIAC: Hear sounds regular, 2/6 systolic murmur at the apex. RESPIRATORY:  Clear to auscultation without rales, wheezing or rhonchi  ABDOMEN: Soft, non-tender, non-distended MUSCULOSKELETAL:  No edema; No deformity  SKIN: Warm and  dry NEUROLOGIC:  Alert and oriented x 3 PSYCHIATRIC:  Normal affect   Signed, Garwin Brothers, MD  12/24/2023 2:57 PM    [facility]

## 2023-12-24 NOTE — Patient Instructions (Signed)
Medication Instructions:  Your physician has recommended you make the following change in your medication:   Start 81 mg coated aspirin daily.  *If you need a refill on your cardiac medications before your next appointment, please call your pharmacy*   Lab Work: None ordered If you have labs (blood work) drawn today and your tests are completely normal, you will receive your results only by: MyChart Message (if you have MyChart) OR A paper copy in the mail If you have any lab test that is abnormal or we need to change your treatment, we will call you to review the results.   Testing/Procedures: None ordered   Follow-Up: At [facility], you and your health needs are our priority.  As part of our continuing mission to provide you with exceptional heart care, we have created designated Provider Care Teams.  These Care Teams include your primary Cardiologist (physician) and Advanced Practice Providers (APPs -  Physician Assistants and Nurse Practitioners) who all work together to provide you with the care you need, when you need it.  We recommend signing up for the patient portal called "MyChart".  Sign up information is provided on this After Visit Summary.  MyChart is used to connect with patients for Virtual Visits (Telemedicine).  Patients are able to view lab/test results, encounter notes, upcoming appointments, etc.  Non-urgent messages can be sent to your provider as well.   To learn more about what you can do with MyChart, go to [email or web address].    Your next appointment:   12 month(s)  The format for your next appointment:   In Person  Provider:   Belva Crome, MD    Other Instructions none  Important Information About Sugar

## 2023-12-25 ENCOUNTER — Other Ambulatory Visit: Payer: Self-pay | Admitting: Family Medicine

## 2023-12-25 ENCOUNTER — Other Ambulatory Visit: Payer: Self-pay | Admitting: Cardiology

## 2023-12-27 ENCOUNTER — Other Ambulatory Visit: Payer: Self-pay | Admitting: Family Medicine

## 2024-01-22 ENCOUNTER — Other Ambulatory Visit: Payer: Self-pay | Admitting: Family Medicine

## 2024-02-14 ENCOUNTER — Other Ambulatory Visit: Payer: Self-pay | Admitting: Family Medicine

## 2024-02-18 ENCOUNTER — Encounter: Payer: Self-pay | Admitting: Family Medicine

## 2024-02-18 ENCOUNTER — Ambulatory Visit (INDEPENDENT_AMBULATORY_CARE_PROVIDER_SITE_OTHER): Payer: Commercial Managed Care - PPO | Admitting: Family Medicine

## 2024-02-18 VITALS — BP 128/72 | HR 72 | Temp 97.7°F | Ht 63.0 in | Wt 201.3 lb

## 2024-02-18 DIAGNOSIS — Z0000 Encounter for general adult medical examination without abnormal findings: Secondary | ICD-10-CM

## 2024-02-18 DIAGNOSIS — Z125 Encounter for screening for malignant neoplasm of prostate: Secondary | ICD-10-CM | POA: Diagnosis not present

## 2024-02-18 DIAGNOSIS — E119 Type 2 diabetes mellitus without complications: Secondary | ICD-10-CM

## 2024-02-18 MED ORDER — VENTOLIN HFA 108 (90 BASE) MCG/ACT IN AERS: 2.0000 | INHALATION_SPRAY | RESPIRATORY_TRACT | 0 refills | Status: AC | PRN

## 2024-02-18 MED ORDER — BUPROPION HCL ER (XL) 150 MG PO TB24: 150.0000 mg | ORAL_TABLET | Freq: Every day | ORAL | 0 refills | Status: DC

## 2024-02-18 NOTE — Progress Notes (Signed)
   Subjective:    Patient ID: Jacob Crawford, male    DOB: 1956-08-16, 68 y.o.   MRN: [id number]  HPI CPE- due for eye exam,colonoscopy, microalbumin, foot exam.  UTD on Tdap.  Patient Care Team    Relationship Specialty Notifications Start End  Sheliah Hatch, MD PCP - General Family Medicine  12/20/12   Mardella Layman, MD Consulting Physician Gastroenterology  09/05/15   Nyoka Cowden, MD Consulting Physician Pulmonary Disease  09/05/15   Wynona Canes, MD Referring Physician Urology  09/05/15     Health Maintenance  Topic Date Due   Medicare Annual Wellness (AWV)  Never done   Pneumonia Vaccine 4+ Years old (1 of 2 - PCV) Never done   Colonoscopy  02/17/2018   OPHTHALMOLOGY EXAM  06/04/2023   Diabetic kidney evaluation - Urine ACR  01/22/2024   FOOT EXAM  01/22/2024   HEMOGLOBIN A1C  02/18/2024   INFLUENZA VACCINE  06/09/2024   Diabetic kidney evaluation - eGFR measurement  08/19/2024   DTaP/Tdap/Td (3 - Td or Tdap) 01/27/2028   Hepatitis C Screening  Completed   HPV VACCINES  Aged Out   Meningococcal B Vaccine  Aged Out   COVID-19 Vaccine  Discontinued   HIV Screening  Discontinued   Zoster Vaccines- Shingrix  Discontinued      Review of Systems Patient reports no vision/hearing changes, anorexia, fever ,adenopathy, persistant/recurrent hoarseness, swallowing issues, chest pain, palpitations, edema, persistant/recurrent cough, hemoptysis, dyspnea (rest,exertional, paroxysmal nocturnal), gastrointestinal  bleeding (melena, rectal bleeding), abdominal pain, excessive heart burn, GU symptoms (dysuria, hematuria, voiding/incontinence issues) syncope, focal weakness, memory loss, numbness & tingling, skin/hair/nail changes, depression, anxiety, abnormal bruising/bleeding, musculoskeletal symptoms/signs.     Objective:   Physical Exam General Appearance:    Alert, cooperative, no distress, appears stated age, obese  Head:    Normocephalic, without obvious  abnormality, atraumatic  Eyes:    PERRL, conjunctiva/corneas clear, EOM's intact both eyes       Ears:    Normal TM's and external ear canals, both ears  Nose:   Nares normal, septum midline, mucosa normal, no drainage   or sinus tenderness  Throat:   Lips, mucosa, and tongue normal; teeth and gums normal  Neck:   Supple, symmetrical, trachea midline, no adenopathy;       thyroid:  No enlargement/tenderness/nodules  Back:     Symmetric, no curvature, ROM normal, no CVA tenderness  Lungs:     Clear to auscultation bilaterally, respirations unlabored  Chest wall:    No tenderness or deformity  Heart:    Regular rate and rhythm, S1 and S2 normal, no murmur, rub   or gallop  Abdomen:     Soft, non-tender, bowel sounds active all four quadrants,    no masses, no organomegaly  Genitalia:    deferred  Rectal:    Extremities:   Extremities normal, atraumatic, no cyanosis or edema  Pulses:   2+ and symmetric all extremities  Skin:   Skin color, texture, turgor normal, no rashes or lesions  Lymph nodes:   Cervical, supraclavicular, and axillary nodes normal  Neurologic:   CNII-XII intact. Normal strength, sensation and reflexes      throughout          Assessment & Plan:

## 2024-02-18 NOTE — Patient Instructions (Signed)
 Follow up in 6 months to recheck sugar, BP, cholesterol We'll notify you of your lab results and make any changes if needed Continue to work on healthy diet and regular exercise- you're doing great! Schedule an eye exam at your convenience and have them send me a copy of the report Call GI and set up your colonoscopy Call with any questions or concerns Stay Safe!  Stay Healthy! Happy Jacob Crawford!!!

## 2024-02-19 ENCOUNTER — Encounter: Payer: Self-pay | Admitting: Family Medicine

## 2024-02-19 LAB — BASIC METABOLIC PANEL WITH GFR
BUN: 16 mg/dL (ref 7–25)
CO2: 28 mmol/L (ref 20–32)
Calcium: 9.3 mg/dL (ref 8.6–10.3)
Chloride: 106 mmol/L (ref 98–110)
Creat: 0.83 mg/dL (ref 0.70–1.35)
Glucose, Bld: 93 mg/dL (ref 65–99)
Potassium: 4.5 mmol/L (ref 3.5–5.3)
Sodium: 142 mmol/L (ref 135–146)
eGFR: 97 mL/min/{1.73_m2} (ref >=60)

## 2024-02-19 LAB — LIPID PANEL
Cholesterol: 108 mg/dL (ref <200)
HDL: 47 mg/dL (ref >=40)
LDL Cholesterol (Calc): 47 mg/dL
Non-HDL Cholesterol (Calc): 61 mg/dL (ref <130)
Total CHOL/HDL Ratio: 2.3 (calc) (ref <5.0)
Triglycerides: 55 mg/dL (ref <150)

## 2024-02-19 LAB — MICROALBUMIN / CREATININE URINE RATIO
Creatinine, Urine: 35 mg/dL (ref 20–320)
Microalb Creat Ratio: 54 mg/g{creat} — ABNORMAL HIGH (ref <30)
Microalb, Ur: 1.9 mg/dL

## 2024-02-19 LAB — HEPATIC FUNCTION PANEL
AG Ratio: 1.8 (calc) (ref 1.0–2.5)
ALT: 37 U/L (ref 9–46)
AST: 18 U/L (ref 10–35)
Albumin: 4.5 g/dL (ref 3.6–5.1)
Alkaline phosphatase (APISO): 77 U/L (ref 35–144)
Bilirubin, Direct: 0.2 mg/dL (ref 0.0–0.2)
Globulin: 2.5 g/dL (ref 1.9–3.7)
Indirect Bilirubin: 0.4 mg/dL (ref 0.2–1.2)
Total Bilirubin: 0.6 mg/dL (ref 0.2–1.2)
Total Protein: 7 g/dL (ref 6.1–8.1)

## 2024-02-19 LAB — CBC WITH DIFFERENTIAL/PLATELET
Absolute Lymphocytes: 1326 {cells}/uL (ref 850–3900)
Absolute Monocytes: 415 {cells}/uL (ref 200–950)
Basophils Absolute: 41 {cells}/uL (ref 0–200)
Basophils Relative: 0.6 %
Eosinophils Absolute: 150 {cells}/uL (ref 15–500)
Eosinophils Relative: 2.2 %
HCT: 49.6 % (ref 38.5–50.0)
Hemoglobin: 16.7 g/dL (ref 13.2–17.1)
MCH: 30.4 pg (ref 27.0–33.0)
MCHC: 33.7 g/dL (ref 32.0–36.0)
MCV: 90.2 fL (ref 80.0–100.0)
MPV: 10 fL (ref 7.5–12.5)
Monocytes Relative: 6.1 %
Neutro Abs: 4869 {cells}/uL (ref 1500–7800)
Neutrophils Relative %: 71.6 %
Platelets: 178 10*3/uL (ref 140–400)
RBC: 5.5 10*6/uL (ref 4.20–5.80)
RDW: 12.3 % (ref 11.0–15.0)
Total Lymphocyte: 19.5 %
WBC: 6.8 10*3/uL (ref 3.8–10.8)

## 2024-02-19 LAB — TSH: TSH: 1.74 m[IU]/L (ref 0.40–4.50)

## 2024-02-19 LAB — HEMOGLOBIN A1C
Hgb A1c MFr Bld: 5.3 %{Hb} (ref <5.7)
Mean Plasma Glucose: 105 mg/dL
eAG (mmol/L): 5.8 mmol/L

## 2024-02-19 NOTE — Assessment & Plan Note (Signed)
 Chronic problem. Foot exam done today, microalbumin and A1C ordered.  Pt to schedule eye exam.

## 2024-02-19 NOTE — Assessment & Plan Note (Signed)
 Pt's PE WNL w/ exception of BMI.  Due for eye exam and colonoscopy- pt to schedule.  Foot exam done today.  Check labs.  Anticipatory guidance provided.

## 2024-03-24 ENCOUNTER — Other Ambulatory Visit: Payer: Self-pay | Admitting: Family Medicine

## 2024-03-30 ENCOUNTER — Encounter: Payer: Self-pay | Admitting: Family Medicine

## 2024-03-30 ENCOUNTER — Ambulatory Visit: Admitting: Family Medicine

## 2024-03-30 DIAGNOSIS — R0989 Other specified symptoms and signs involving the circulatory and respiratory systems: Secondary | ICD-10-CM | POA: Diagnosis not present

## 2024-03-30 DIAGNOSIS — R5383 Other fatigue: Secondary | ICD-10-CM

## 2024-03-30 DIAGNOSIS — R52 Pain, unspecified: Secondary | ICD-10-CM

## 2024-03-30 LAB — POC COVID19 BINAXNOW: SARS Coronavirus 2 Ag: NEGATIVE

## 2024-03-30 LAB — POCT INFLUENZA A/B
Influenza A, POC: NEGATIVE
Influenza B, POC: NEGATIVE

## 2024-03-30 NOTE — Progress Notes (Signed)
   Subjective:    Patient ID: Jacob Crawford, male    DOB: [birth year], 68 y.o.   MRN: [id number]  HPI Fatigue- 'i feel like crap'.  Sxs started Tuesday morning.  Feels 'washed out'.  No fever.  No chills.  + 'weird headaches'- will come and go.  L sided above eye, 'radiates out of my temple'.  Had body aches from waist down Sunday and Monday night.  No cough, no congestion.  Pt has multiple insect bites on L lower lateral leg- itchy.     Review of Systems For ROS see HPI     Objective:   Physical Exam Vitals reviewed.  Constitutional:      General: He is not in acute distress.    Appearance: Normal appearance. He is obese. He is not ill-appearing.  HENT:     Head: Normocephalic and atraumatic.     Right Ear: Tympanic membrane and ear canal normal.     Left Ear: Tympanic membrane and ear canal normal.     Nose: No congestion.     Comments: No TTP over frontal or maxillary sinuses Eyes:     Extraocular Movements: Extraocular movements intact.     Conjunctiva/sclera: Conjunctivae normal.  Cardiovascular:     Rate and Rhythm: Normal rate and regular rhythm.  Pulmonary:     Effort: Pulmonary effort is normal. No respiratory distress.     Breath sounds: No wheezing or rhonchi.  Abdominal:     General: There is no distension.     Palpations: Abdomen is soft.     Tenderness: There is no abdominal tenderness. There is no guarding or rebound.  Musculoskeletal:     Cervical back: Neck supple. No rigidity.  Lymphadenopathy:     Cervical: No cervical adenopathy.  Skin:    General: Skin is warm and dry.     Findings: Lesion (scabbed insect bites on L lateral lower leg) present. No rash.  Neurological:     General: No focal deficit present.     Mental Status: He is alert and oriented to person, place, and time.  Psychiatric:        Mood and Affect: Mood normal.        Behavior: Behavior normal.        Thought Content: Thought content normal.           Assessment & Plan:   Fatigue/Body aches- new.  Sxs started ~2 days ago.  No fevers or chills.  'it's like I have the flu w/o the other sxs'.  No rashes, no known tick bites.  Flu and COVID negative.  Will check labs to assess for illness, electrolyte disturbance, muscle breakdown (given his severe lower extremity aches).  Encouraged fluids, rest.  Suspect nonspecific viral illness but pt to alert me if anything changes.  Pt expressed understanding and is in agreement w/ plan.

## 2024-03-30 NOTE — Patient Instructions (Signed)
 Follow up as needed or as scheduled Thankfully both flu and COVID are negative We will notify you of your lab results and make any changes if needed Drink LOTS of fluids REST!!! Tylenol  or Ibuprofen as needed for headache or body aches Call with any questions or concerns Hang in there!

## 2024-03-31 ENCOUNTER — Encounter (HOSPITAL_BASED_OUTPATIENT_CLINIC_OR_DEPARTMENT_OTHER): Payer: Self-pay

## 2024-03-31 ENCOUNTER — Emergency Department (HOSPITAL_BASED_OUTPATIENT_CLINIC_OR_DEPARTMENT_OTHER)

## 2024-03-31 ENCOUNTER — Other Ambulatory Visit: Payer: Self-pay

## 2024-03-31 ENCOUNTER — Emergency Department (HOSPITAL_BASED_OUTPATIENT_CLINIC_OR_DEPARTMENT_OTHER)
Admission: EM | Admit: 2024-03-31 | Discharge: 2024-03-31 | Disposition: A | Discharge status: Home / Self Care | Attending: Emergency Medicine | Admitting: Emergency Medicine

## 2024-03-31 DIAGNOSIS — R0789 Other chest pain: Secondary | ICD-10-CM | POA: Insufficient documentation

## 2024-03-31 DIAGNOSIS — R519 Headache, unspecified: Secondary | ICD-10-CM | POA: Diagnosis not present

## 2024-03-31 DIAGNOSIS — I10 Essential (primary) hypertension: Secondary | ICD-10-CM | POA: Diagnosis not present

## 2024-03-31 DIAGNOSIS — Z7984 Long term (current) use of oral hypoglycemic drugs: Secondary | ICD-10-CM | POA: Insufficient documentation

## 2024-03-31 DIAGNOSIS — E119 Type 2 diabetes mellitus without complications: Secondary | ICD-10-CM | POA: Insufficient documentation

## 2024-03-31 DIAGNOSIS — Z79899 Other long term (current) drug therapy: Secondary | ICD-10-CM | POA: Insufficient documentation

## 2024-03-31 DIAGNOSIS — R079 Chest pain, unspecified: Secondary | ICD-10-CM

## 2024-03-31 DIAGNOSIS — R5383 Other fatigue: Secondary | ICD-10-CM | POA: Diagnosis not present

## 2024-03-31 DIAGNOSIS — R110 Nausea: Secondary | ICD-10-CM | POA: Diagnosis not present

## 2024-03-31 DIAGNOSIS — R0682 Tachypnea, not elsewhere classified: Secondary | ICD-10-CM | POA: Diagnosis not present

## 2024-03-31 DIAGNOSIS — Z8616 Personal history of COVID-19: Secondary | ICD-10-CM | POA: Insufficient documentation

## 2024-03-31 DIAGNOSIS — R918 Other nonspecific abnormal finding of lung field: Secondary | ICD-10-CM | POA: Diagnosis not present

## 2024-03-31 DIAGNOSIS — Z7982 Long term (current) use of aspirin: Secondary | ICD-10-CM | POA: Diagnosis not present

## 2024-03-31 LAB — BASIC METABOLIC PANEL WITH GFR
Anion gap: 18 — ABNORMAL HIGH (ref 5–15)
BUN: 11 mg/dL (ref 6–23)
BUN: 10 mg/dL (ref 8–23)
CO2: 28 meq/L (ref 19–32)
CO2: 21 mmol/L — ABNORMAL LOW (ref 22–32)
Calcium: 9.6 mg/dL (ref 8.4–10.5)
Calcium: 9.5 mg/dL (ref 8.9–10.3)
Chloride: 105 meq/L (ref 96–112)
Chloride: 103 mmol/L (ref 98–111)
Creatinine, Ser: 0.82 mg/dL (ref 0.40–1.50)
Creatinine, Ser: 0.85 mg/dL (ref 0.61–1.24)
GFR, Estimated: >60 mL/min (ref >60)
GFR: 92.25 mL/min (ref >60.00)
Glucose, Bld: 80 mg/dL (ref 70–99)
Glucose, Bld: 91 mg/dL (ref 70–99)
Potassium: 4.3 meq/L (ref 3.5–5.1)
Potassium: 3.6 mmol/L (ref 3.5–5.1)
Sodium: 144 meq/L (ref 135–145)
Sodium: 142 mmol/L (ref 135–145)

## 2024-03-31 LAB — CBC WITH DIFFERENTIAL/PLATELET
Basophils Absolute: 0 10*3/uL (ref 0.0–0.1)
Basophils Relative: 0.4 % (ref 0.0–3.0)
Eosinophils Absolute: 0.1 10*3/uL (ref 0.0–0.7)
Eosinophils Relative: 1.3 % (ref 0.0–5.0)
HCT: 48.6 % (ref 39.0–52.0)
Hemoglobin: 16.4 g/dL (ref 13.0–17.0)
Lymphocytes Relative: 19.8 % (ref 12.0–46.0)
Lymphs Abs: 0.9 10*3/uL (ref 0.7–4.0)
MCHC: 33.7 g/dL (ref 30.0–36.0)
MCV: 89.6 fl (ref 78.0–100.0)
Monocytes Absolute: 0.6 10*3/uL (ref 0.1–1.0)
Monocytes Relative: 11.7 % (ref 3.0–12.0)
Neutro Abs: 3.2 10*3/uL (ref 1.4–7.7)
Neutrophils Relative %: 66.8 % (ref 43.0–77.0)
Platelets: 139 10*3/uL — ABNORMAL LOW (ref 150.0–400.0)
RBC: 5.42 Mil/uL (ref 4.22–5.81)
RDW: 13.7 % (ref 11.5–15.5)
WBC: 4.8 10*3/uL (ref 4.0–10.5)

## 2024-03-31 LAB — LIPID PANEL
Cholesterol: 95 mg/dL (ref 0–200)
HDL: 47 mg/dL (ref >39.00)
LDL Cholesterol: 38 mg/dL (ref 0–99)
NonHDL: 48.46
Total CHOL/HDL Ratio: 2
Triglycerides: 51 mg/dL (ref 0.0–149.0)
VLDL: 10.2 mg/dL (ref 0.0–40.0)

## 2024-03-31 LAB — TSH: TSH: 3.03 u[IU]/mL (ref 0.35–5.50)

## 2024-03-31 LAB — CBC
HCT: 49.3 % (ref 39.0–52.0)
Hemoglobin: 16.8 g/dL (ref 13.0–17.0)
MCH: 30.4 pg (ref 26.0–34.0)
MCHC: 34.1 g/dL (ref 30.0–36.0)
MCV: 89.2 fL (ref 80.0–100.0)
Platelets: 156 10*3/uL (ref 150–400)
RBC: 5.53 MIL/uL (ref 4.22–5.81)
RDW: 13.2 % (ref 11.5–15.5)
WBC: 6.4 10*3/uL (ref 4.0–10.5)
nRBC: 0 % (ref 0.0–0.2)

## 2024-03-31 LAB — TROPONIN T, HIGH SENSITIVITY
Troponin T High Sensitivity: <15 ng/L (ref <19)
Troponin T High Sensitivity: <15 ng/L (ref <19)

## 2024-03-31 LAB — VITAMIN D 25 HYDROXY (VIT D DEFICIENCY, FRACTURES): VITD: 24.51 ng/mL — ABNORMAL LOW (ref 30.00–100.00)

## 2024-03-31 LAB — CK: Total CK: 98 U/L (ref 7–232)

## 2024-03-31 NOTE — Discharge Instructions (Addendum)
 While you are in the emergency room, you had blood work done that was normal.  Your chest x-ray and EKG were also normal.  Please follow-up with your cardiologist and your primary care doctor within 1 week.  Return to the emergency room if develop worsening pain in your chest, difficulty breathing, or lose consciousness.  I would recommend not taking Goody powder.  You can take other NSAIDs like ibuprofen or Motrin for headache as needed.

## 2024-03-31 NOTE — ED Notes (Signed)
 ED Provider at bedside.

## 2024-03-31 NOTE — ED Provider Notes (Addendum)
 [place] [facility] Provider Note  CSN: [id number] Arrival date & time: 03/31/24 1301  Chief Complaint(s) Chest Pain  HPI Jacob Crawford is a 68 y.o. male who is here today for several days of feeling unwell.  Patient says that "I feel washed out."  Patient was seen by his PCP yesterday, had blood work done.  Patient reports that he has intermittently had a headache, at times felt nauseated.  He says that yesterday he felt some pain on the left side of his chest with radiation down his left arm.  Patient reports that he has been taking Goody powders since Tuesday.  He has a history of hypertension and hyperlipidemia.   Past Medical History Past Medical History:  Diagnosis Date   Abdominal pain, epigastric 11/01/2013   Allergic rhinitis 03/01/2017   Anxiety and depression 02/07/2018   Diabetes mellitus without complication (HCC)    Erectile dysfunction 11/11/2021   Essential hypertension 12/20/2012   Changed losartan  to valsartan  due to cough  06/04/2015 >>>    H/O seasonal allergies    Hematuria 05/20/2015   History of 2019 novel coronavirus disease (COVID-19) 09/29/2019   Hyperlipidemia associated with type 2 diabetes mellitus (HCC) 06/22/2022   Lipoma of back 12/20/2012   Meralgia paresthetica of left side 12/20/2012   Morbid obesity (HCC) 12/20/2012   OSA (obstructive sleep apnea) 03/30/2013   Routine general medical examination at a health care facility 12/27/2012   Seasonal allergic rhinitis due to pollen 03/01/2017   Skin lesion of face 09/05/2015   Solitary pulmonary nodule 06/04/2015   CT abd 05/20/15 = 4 mm RLL  - CT chest 05/19/16 Basilar nodules seen on the previous abdomen and pelvis CT are stable in the interval, consistent with benign disease. Patient also has a 7 mm perifissural nodule in the right lung which was present previously but incompletely visualized, most likely representing subpleural lymph node > no f/u needed     Upper airway cough syndrome 06/04/2015   Followed in Pulmonary clinic/ [facility]/ [facility] - prev h/o ACEi cough  - trial off losartan  and max gerd rx 06/04/2015 >>>     Patient Active Problem List   Diagnosis Date Noted   Coronary atherosclerosis 12/24/2023   Hyperlipidemia associated with type 2 diabetes mellitus (HCC) 06/22/2022   H/O seasonal allergies 03/04/2022   Erectile dysfunction 11/11/2021   Diabetes mellitus without complication (HCC) 09/29/2019   History of 2019 novel coronavirus disease (COVID-19) 09/29/2019   Anxiety and depression 02/07/2018   Allergic rhinitis 03/01/2017   Seasonal allergic rhinitis due to pollen 03/01/2017   Skin lesion of face 09/05/2015   Upper airway cough syndrome 06/04/2015   Solitary pulmonary nodule 06/04/2015   Hematuria 05/20/2015   Abdominal pain, epigastric 11/01/2013   OSA (obstructive sleep apnea) 03/30/2013   Routine general medical examination at a health care facility 12/27/2012   Essential hypertension 12/20/2012   Lipoma of back 12/20/2012   Meralgia paresthetica of left side 12/20/2012   Morbid obesity (HCC) 12/20/2012   Home Medication(s) Prior to Admission medications   Medication Sig Start Date End Date Taking? Authorizing Provider  amLODipine  (NORVASC ) 5 MG tablet Take 1 tablet (5 mg total) by mouth daily. 11/18/23   Tabori, Katherine E, MD  aspirin  EC 81 MG tablet Take 1 tablet (81 mg total) by mouth daily. Swallow whole. 12/24/23   Revankar, Rajan R, MD  Aspirin -Acetaminophen -Caffeine (GOODY HEADACHE PO) Take 1 packet by mouth once as needed for pain  or headache.       [provider]  buPROPion  (WELLBUTRIN  XL) 150 MG 24 hr tablet Take 1 tablet (150 mg total) by mouth daily. 02/18/24   Tabori, Katherine E, MD  Cetirizine HCl (ZYRTEC PO) Take 1 tablet by mouth daily. Patient not taking: Reported on 03/30/2024    [provider]  glucose blood (ONETOUCH VERIO) test strip USE AS INSTRUCTED TO TEST SUGARS 1 TO  2 TIMES DAILY 08/03/22   Tabori, Katherine E, MD  Lancets [facility] ULTRASOFT) lancets Use as instructed to test sugars 1-2 times daily. Dx E11.9 05/24/20   Jess Morita, MD  rosuvastatin  (CRESTOR ) 5 MG tablet TAKE 1 TABLET BY MOUTH EVERY DAY 12/27/23   Revankar, Micael Adas, MD  sildenafil  (VIAGRA ) 100 MG tablet TAKE 1/2 TO 1 TABLET BY MOUTH EVERY DAY AS NEEDED FOR ERECTILE DYSFUNCTION 03/24/24   Tabori, Katherine E, MD  Triamcinolone Acetonide (NASACORT ALLERGY 24HR NA) Place 1-2 sprays into the nose as needed for rhinitis or allergies.    [provider]  valsartan  (DIOVAN ) 320 MG tablet TAKE 1 TABLET BY MOUTH EVERY DAY 03/24/24   Tabori, Katherine E, MD  VENTOLIN  HFA 108 (90 Base) MCG/ACT inhaler Inhale 2 puffs into the lungs every 4 (four) hours as needed for wheezing or shortness of breath. 02/18/24   Jess Morita, MD                                                                                                                                    Past Surgical History Past Surgical History:  Procedure Laterality Date   FRACTURE SURGERY  1965   skull   Family History Family History  Problem Relation Age of Onset   Arthritis Maternal Grandmother    Arthritis Maternal Grandfather    Heart disease Maternal Grandfather        MI   Arthritis Paternal Grandmother    Arthritis Paternal Grandfather    Colon cancer Paternal Grandfather        died with colon cancer at age 58   Sleep apnea Mother    Stroke Mother    Hypertension Mother    Heart disease Father        multiple stent placements.   COPD Father        smoked   Hypertension Father    AAA (abdominal aortic aneurysm) Father    Cancer Father        ureter   Heart disease Cousin        MI   Mental illness Son 72       anxiety    Social History Social History   Tobacco Use   Smoking status: Never   Smokeless tobacco: Never  Vaping Use   Vaping status: Never Used  Substance Use Topics   Alcohol use: No    Drug use: No   Allergies Enalapril maleate, Codeine, Hydrochlorothiazide , and  Sulfa antibiotics  Review of Systems Review of Systems  Physical Exam Vital Signs  I have reviewed the triage vital signs BP (!) 160/81 (BP Location: Right Arm)   Pulse 69   Temp 98.2 F (36.8 C) (Oral)   Resp (!) 22   Ht 5\' 3"  (1.6 m)   Wt 89.8 kg   SpO2 100%   BMI 35.07 kg/m   Physical Exam Vitals and nursing note reviewed.  Constitutional:      Appearance: He is not toxic-appearing.  Cardiovascular:     Rate and Rhythm: Normal rate and regular rhythm.     Heart sounds: Normal heart sounds.  Pulmonary:     Effort: Pulmonary effort is normal.     Breath sounds: Normal breath sounds.  Chest:     Chest wall: No mass, tenderness or edema.  Skin:    General: Skin is warm.  Neurological:     Mental Status: He is alert.     ED Results and Treatments Labs (all labs ordered are listed, but only abnormal results are displayed) Labs Reviewed  BASIC METABOLIC PANEL WITH GFR - Abnormal; Notable for the following components:      Result Value   CO2 21 (*)    Anion gap 18 (*)    All other components within normal limits  CBC  TROPONIN T, HIGH SENSITIVITY  TROPONIN T, HIGH SENSITIVITY                                                                                                                          Radiology DG Chest 2 View Result Date: 03/31/2024 CLINICAL DATA:  Chest pain.  Nausea and fatigue for 4 days EXAM: CHEST - 2 VIEW COMPARISON:  X-ray 12/02/2020 FINDINGS: Linear opacity lung bases likely scar or atelectasis. No consolidation, pneumothorax or effusion. No edema. Normal cardiopericardial silhouette. IMPRESSION: Slight linear opacity along the lung bases likely scar or atelectasis. Electronically Signed   By: Adrianna Horde M.D.   On: 03/31/2024 15:43    Pertinent labs & imaging results that were available during my care of the patient were reviewed by me and considered in my  medical decision making (see MDM for details).  Medications Ordered in ED Medications - No data to display  Procedures Ultrasound ED Echo  Date/Time: 03/31/2024 5:10 PM  Performed by: Nathanael Baker, DO Authorized by: Nathanael Baker, DO   Procedure details:    Indications: chest pain     Views: parasternal long axis view and parasternal short axis view     Images: not archived     Limitations:  Body habitus Findings:    Pericardium: no pericardial effusion     LV Function: normal (>50% EF)     RV Diameter: normal     Other signs of RV strain: paradoxical septal motion   Impression:    Impression: normal     (including critical care time)  Medical Decision Making / ED Course   This patient presents to the ED for concern of feeling unwell, this involves an extensive number of treatment options, and is a complaint that carries with it a high risk of complications and morbidity.  The differential diagnosis includes stable angina, viral syndrome, less likely salicylate toxicity, considered ACS, less likely PE, less likely pericardial effusion.  MDM: On exam, patient overall with normal vital signs.  Had labs drawn at triage which show normal electrolytes, normal hemoglobin.  Does have a slight anion gap at 18.  Some mild tachypnea.  However this appears transient.  Patient had not taken Goody powder.  I considered whether or not to check a salicylate level in this patient, however I am sure it would be a detectable amount, but clinically this patient does not have this with slight toxicity.  His anion gap is very slight.  Performed bedside ultrasound of the patient which overall is normal.  Due to negative high-sensitivity troponins.  His EKG shows a stable fascicular block.  Will discharge patient with PCP follow-up.  He will also call his cardiologist  to set up an outpatient appointment.  Heart score of 3.   Additional history obtained: -Additional history obtained from wife at bedside -External records from outside source obtained and reviewed including: Chart review including previous notes, labs, imaging, consultation notes   Lab Tests: -I ordered, reviewed, and interpreted labs.   The pertinent results include:   Labs Reviewed  BASIC METABOLIC PANEL WITH GFR - Abnormal; Notable for the following components:      Result Value   CO2 21 (*)    Anion gap 18 (*)    All other components within normal limits  CBC  TROPONIN T, HIGH SENSITIVITY  TROPONIN T, HIGH SENSITIVITY      EKG sinus rhythm, fascicular block.  EKG Interpretation Date/Time:  Friday Mar 31 2024 13:11:33 EDT Ventricular Rate:  75 PR Interval:  171 QRS Duration:  102 QT Interval:  391 QTC Calculation: 437 R Axis:   112  Text Interpretation: Sinus rhythm Left posterior fascicular block Consider anterior infarct Confirmed by Afton Horse [phone]) on 03/31/2024 3:35:05 PM         Imaging Studies ordered: I ordered imaging studies including chest x-ray I independently visualized and interpreted imaging. I agree with the radiologist interpretation   Medicines ordered and prescription drug management: No orders of the defined types were placed in this encounter.   -I have reviewed the patients home medicines and have made adjustments as needed     Cardiac Monitoring: The patient was maintained on a cardiac monitor.  I personally viewed and interpreted the cardiac monitored which showed an underlying rhythm of: Normal sinus rhythm  Social Determinants of Health:  Factors impacting patients care include: Lack of access to primary care  Reevaluation: After the interventions noted above, I reevaluated the patient and found that they have :improved  Co morbidities that complicate the patient evaluation  Past Medical History:  Diagnosis Date    Abdominal pain, epigastric 11/01/2013   Allergic rhinitis 03/01/2017   Anxiety and depression 02/07/2018   Diabetes mellitus without complication (HCC)    Erectile dysfunction 11/11/2021   Essential hypertension 12/20/2012   Changed losartan  to valsartan  due to cough  06/04/2015 >>>    H/O seasonal allergies    Hematuria 05/20/2015   History of 2019 novel coronavirus disease (COVID-19) 09/29/2019   Hyperlipidemia associated with type 2 diabetes mellitus (HCC) 06/22/2022   Lipoma of back 12/20/2012   Meralgia paresthetica of left side 12/20/2012   Morbid obesity (HCC) 12/20/2012   OSA (obstructive sleep apnea) 03/30/2013   Routine general medical examination at a health care facility 12/27/2012   Seasonal allergic rhinitis due to pollen 03/01/2017   Skin lesion of face 09/05/2015   Solitary pulmonary nodule 06/04/2015   CT abd 05/20/15 = 4 mm RLL  - CT chest 05/19/16 Basilar nodules seen on the previous abdomen and pelvis CT are stable in the interval, consistent with benign disease. Patient also has a 7 mm perifissural nodule in the right lung which was present previously but incompletely visualized, most likely representing subpleural lymph node > no f/u needed    Upper airway cough syndrome 06/04/2015   Followed in Pulmonary clinic/ [facility]/ [place] - prev h/o ACEi cough  - trial off losartan  and max gerd rx 06/04/2015 >>>        Dispostion: I considered admission for this patient, however he is appropriate for outpatient follow-up.     Final Clinical Impression(s) / ED Diagnoses Final diagnoses:  None     @PCDICTATION @    Afton Horse T, DO 03/31/24 1709    Afton Horse T, DO 03/31/24 1710

## 2024-03-31 NOTE — ED Triage Notes (Signed)
 Intermittent left sided chest pain  this morning. Pt at work and became nauseated . Pain in left arm radiating down arm  Feeling "washed out "since Tuesday. Seen by PCP yesterday and had blood work. Pt has taken 7 goody powders since tuesday

## 2024-04-03 ENCOUNTER — Encounter: Payer: Self-pay | Admitting: Family Medicine

## 2024-04-04 ENCOUNTER — Telehealth: Payer: Self-pay | Admitting: Cardiology

## 2024-04-04 ENCOUNTER — Encounter: Payer: Self-pay | Admitting: Family Medicine

## 2024-04-04 ENCOUNTER — Ambulatory Visit: Payer: Self-pay | Admitting: Family Medicine

## 2024-04-04 LAB — ROCKY MTN SPOTTED FVR ABS PNL(IGG+IGM)
RMSF IgG: NOT DETECTED
RMSF IgM: NOT DETECTED

## 2024-04-04 MED ORDER — VITAMIN D (ERGOCALCIFEROL) 1.25 MG (50000 UNIT) PO CAPS: 50000.0000 [IU] | ORAL_CAPSULE | ORAL | 0 refills | Status: AC

## 2024-04-04 NOTE — Telephone Encounter (Signed)
 Called the patient and he reported that his blood pressure had been elevated. His past blood pressures are the following:  5/24 - 136/84 5/25 - 158/85 5/26 - 162/84 5/27 - 148/82  When asked about his routine for taking his medication in the morning, he states that he takes his blood pressure upon getting up in the morning, and then takes his blood pressure medication. I explained that he should take his medication first in the morning and then wait 2 hours and check his blood pressure. I explained that this would give us  a better representation of if the medication was working to control his blood pressure. Patient stated that he would start doing that.  He also stated that on last Thursday he was at work and had an episode of chest pain that radiated down his left arm. Patient went to the ER at [facility] and was evaluated for his chest pain. His troponin's were negative and he states that he has not had any chest pain since then. Spoke to Dr. Krasowski and he recommended having him follow up with Dr. Lafayette Pierre. An appointment was scheduled for the patient to follow up with Pattricia Bores, NP due to the availability of a sooner appointment than Dr. Lafayette Pierre had available.Patient verbalized understanding and had no further questions at this time.

## 2024-04-04 NOTE — Telephone Encounter (Signed)
 Patient has sent in BP readings, also states he was seen in the ER on 03/31/2024. Wondering if he should have the amlodipine  increased? Has been taking the hydrochloride for 2 days but has not seen a change. Looking at the ED notes it does not appear to be any medications ordered, ED doc did state patient needed a hospital follow up and did refer patient to cardiology. It does look like a triage note was taken by cardiology this morning.

## 2024-04-04 NOTE — Telephone Encounter (Signed)
 Pt c/o BP issue: STAT if pt c/o blurred vision, one-sided weakness or slurred speech.  STAT if BP is GREATER than 180/120 TODAY.  STAT if BP is LESS than 90/60 and SYMPTOMATIC TODAY  1. What is your BP concern? "High as 170/??  lowest 148/??"  2. Have you taken any BP medication today?Yes  3. What are your last 5 BP readings? Unsure, pt's wife is on the line.  4. Are you having any other symptoms (ex. Dizziness, headache, blurred vision, passed out)? Headaches "Pain down his arm", started yesterday  Pt c/o of Chest Pain: STAT if active (IN THIS MOMENT) CP, including tightness, pressure, jaw pain, shoulder/upper arm/back pain, SOB, nausea, and vomiting.  1. Are you having CP right now (tightness, pressure, or discomfort)? Unsure, pt's wife is on the line   2. Are you experiencing any other symptoms (ex. SOB, nausea, vomiting, sweating)? Heartburn, Headaches, Nausea, and "probably so on sweating, mostly at night"  3. How long have you been experiencing CP? Started last week and came back yesterday   4. Is your CP continuous or coming and going? Coming and going   5. Have you taken Nitroglycerin? No  6. If CP returns before callback, please consider calling 911. ?   Patient's wife requested we call the patient, she will make him aware we are calling due to him being at work. Patient's wife requested if the patient does not answer for us  to contact her. Please advise.

## 2024-04-07 ENCOUNTER — Ambulatory Visit (INDEPENDENT_AMBULATORY_CARE_PROVIDER_SITE_OTHER): Admitting: Family Medicine

## 2024-04-07 ENCOUNTER — Encounter: Payer: Self-pay | Admitting: Family Medicine

## 2024-04-07 VITALS — BP 124/72 | HR 74 | Temp 98.2°F | Ht 63.5 in | Wt 197.4 lb

## 2024-04-07 DIAGNOSIS — I10 Essential (primary) hypertension: Secondary | ICD-10-CM | POA: Diagnosis not present

## 2024-04-07 DIAGNOSIS — Z1211 Encounter for screening for malignant neoplasm of colon: Secondary | ICD-10-CM | POA: Diagnosis not present

## 2024-04-07 MED ORDER — HYDROCHLOROTHIAZIDE 12.5 MG PO TABS: 12.5000 mg | ORAL_TABLET | Freq: Every day | ORAL | 1 refills | Status: DC

## 2024-04-07 NOTE — Patient Instructions (Signed)
 Follow up in 2 months to recheck blood pressure We'll notify you of your lab results and make any changes if needed CONTINUE the hydrochlorothiazide  daily Try and limit your salt intake Drink LOTS of water Call with any questions or concerns Stay Safe!  Stay Healthy! Have a great weekend!!

## 2024-04-07 NOTE — Progress Notes (Signed)
   Subjective:    Patient ID: Jacob Crawford, male    DOB: January 25, 1956, 67 y.o.   MRN: [id number]  HPI HTN- pt recently restarted hydrochlorothiazide  after noticing increased blood pressures and was seen in the ER on 5/23 for CP.  ER w/u was negative.  Has f/u w/ Cards next week.  Currently on Amlodipine  5mg  daily, hydrochlorothiazide  12.5mg  daily.  Had episode of CP yesterday but this was different than previous episode- no nausea, no arm pain.   Review of Systems For ROS see HPI     Objective:   Physical Exam Vitals reviewed.  Constitutional:      General: He is not in acute distress.    Appearance: Normal appearance. He is well-developed. He is not ill-appearing.  HENT:     Head: Normocephalic and atraumatic.  Eyes:     Extraocular Movements: Extraocular movements intact.     Conjunctiva/sclera: Conjunctivae normal.     Pupils: Pupils are equal, round, and reactive to light.  Neck:     Thyroid : No thyromegaly.  Cardiovascular:     Rate and Rhythm: Normal rate and regular rhythm.     Pulses: Normal pulses.     Heart sounds: Normal heart sounds. No murmur heard. Pulmonary:     Effort: Pulmonary effort is normal. No respiratory distress.     Breath sounds: Normal breath sounds.  Abdominal:     General: Bowel sounds are normal. There is no distension.     Palpations: Abdomen is soft.  Musculoskeletal:     Cervical back: Normal range of motion and neck supple.     Right lower leg: No edema.     Left lower leg: No edema.  Lymphadenopathy:     Cervical: No cervical adenopathy.  Skin:    General: Skin is warm and dry.  Neurological:     General: No focal deficit present.     Mental Status: He is alert and oriented to person, place, and time.     Cranial Nerves: No cranial nerve deficit.  Psychiatric:        Mood and Affect: Mood normal.        Behavior: Behavior normal.           Assessment & Plan:

## 2024-04-08 LAB — CBC WITH DIFFERENTIAL/PLATELET
Absolute Lymphocytes: 1380 {cells}/uL (ref 850–3900)
Absolute Monocytes: 504 {cells}/uL (ref 200–950)
Basophils Absolute: 30 {cells}/uL (ref 0–200)
Basophils Relative: 0.5 %
Eosinophils Absolute: 120 {cells}/uL (ref 15–500)
Eosinophils Relative: 2 %
HCT: 47.4 % (ref 38.5–50.0)
Hemoglobin: 15.8 g/dL (ref 13.2–17.1)
MCH: 30.4 pg (ref 27.0–33.0)
MCHC: 33.3 g/dL (ref 32.0–36.0)
MCV: 91.3 fL (ref 80.0–100.0)
MPV: 10.2 fL (ref 7.5–12.5)
Monocytes Relative: 8.4 %
Neutro Abs: 3966 {cells}/uL (ref 1500–7800)
Neutrophils Relative %: 66.1 %
Platelets: 216 10*3/uL (ref 140–400)
RBC: 5.19 10*6/uL (ref 4.20–5.80)
RDW: 12.7 % (ref 11.0–15.0)
Total Lymphocyte: 23 %
WBC: 6 10*3/uL (ref 3.8–10.8)

## 2024-04-08 NOTE — Assessment & Plan Note (Signed)
 Deteriorated.  Reviewed ER notes and labs from last week.  He restarted his hydrochlorothiazide  due to elevated blood pressure.  Thankfully CP has resolved.  Has appt w/ Cardiology next week.  BP normal today.  Will recheck BMP due to addition of diuretic.  No anticipated med changes.  Will follow.

## 2024-04-10 ENCOUNTER — Ambulatory Visit: Payer: Self-pay | Admitting: Family Medicine

## 2024-04-13 NOTE — Progress Notes (Addendum)
 Cardiology Office Note   Date:  04/16/2024  ID:  Jacob Crawford, DOB January 29, 1956, MRN [id number] PCP: Jess Morita, MD  [facility] Providers Cardiologist:  Nelia Balzarine, MD     History of Present Illness Jacob Crawford is a 68 y.o. male with a past medical history of CAD, hypertension, OSA, DM 2, dyslipidemia.  03/24/2022 stress echo negative stress echo, low risk study 03/24/2022 calcium  score 28.7, 42 percentile   He established care with Dr. Lafayette Pierre in 2023 for the evaluation of chest pain of uncertain etiology.  A calcium  score was arranged which was 28.7 in the 42nd percentile, a stress echo was completed which was a negative, low risk study.  Most recently was evaluated in the emergency department for chest pain, he had apparently not been feeling well for a few days, his EKG was unremarkable lab work including high-sensitivity troponin were negative, chest x-ray was normal and bedside ultrasound was normal.  Jacob Crawford presents today accompanied by his wife for follow-up of his mild nonobstructive CAD.  He recently had a visit to the emergency department as outlined above.  We discussed his symptoms leading up to this visit, he had just been feeling poorly, kind of hard for him to decipher for approximately a week, just felt very drained and he stated it was similar to her prior time when he had episodes maintaining his blood pressure.  His HCTZ had previously been discontinued as he has been working on lifestyle modifications and loss of significant amount of weight, naturally as blood pressure medications need to be increased however he restarted his hydrochlorothiazide  . as he notices blood pressure trending up.  He stays very physically active at work, is not participating in formal exercise, primarily the program he is working with for weight loss is not recommended adding exercise at this time.  Is been feeling better the last few days but the episode which prompted  him to visit the emergency department was understandably concerning for him. Today, he denies chest pain, palpitations, dyspnea, pnd, orthopnea, n, v, dizziness, syncope, edema, weight gain, or early satiety.    ROS: Review of Systems  All other systems reviewed and are negative.    Studies Reviewed      Cardiac Studies & Procedures   ______________________________________________________________________________________________   STRESS TESTS  ECHOCARDIOGRAM STRESS TEST 03/24/2022  Narrative EXERCISE STRESS ECHO REPORT   --------------------------------------------------------------------------------  Patient Name:   Jacob Crawford Date of Exam: 03/24/2022 Medical Rec #:  [id number]       Height:       63.6 in Accession #:    [id number]      Weight:       238.6 lb Date of Birth:  [birth year]      BSA:          2.098 m Patient Age:    65 years        BP:           176/78 mmHg Patient Gender: M               HR:           112 bpm. Exam Location:  [address]  Procedure: Stress Echo and Intracardiac Opacification Agent  Indications:    R07.9 Chest Pain  History:        Patient has prior history of Echocardiogram examinations, most recent 03/24/2022.  Sonographer:    Juventino Oppenheim RCS Referring Phys: [id number] RAJAN R REVANKAR  IMPRESSIONS   1. Good exercise tolerance. 2. Mild hypertensive response to exercise. 3. This is a negative stress echocardiogram for ischemia. 4. This is a low risk study.  FINDINGS  Exam Protocol: The patient exercised on a treadmill according to a Bruce protocol.   Patient Performance: The patient exercised for 8 minutes and 08 seconds, achieving 9.50 METS. The maximum stage achieved was III of the Bruce protocol. The heart rate at peak stress was 164 bpm. The target heart rate was calculated to be 133 bpm. The percentage of maximum predicted heart rate achieved was 104.7 %. The baseline blood pressure was 176/78 mmHg. The blood pressure at peak  stress was 207/54 mmHg. The blood pressure response was hypertensive. The patient developed shortness of breath during the stress exam. The patient's functional capacity was average.  EKG: Resting EKG showed normal sinus rhythm. The patient developed no abnormal EKG findings during exercise.   2D Echo Findings: The baseline ejection fraction was 60%. The peak ejection fraction at stress was 80%. Baseline regional wall motion abnormalities were not present. There were no stress-induced wall motion abnormalities. This is a negative stress echocardiogram for ischemia.   Dinah Franco MD Electronically signed on 03/24/2022 at 9:24:00 PM     Final   ECHOCARDIOGRAM  ECHOCARDIOGRAM COMPLETE 03/24/2022  Narrative ECHOCARDIOGRAM REPORT    Patient Name:   Jacob Crawford Date of Exam: 03/24/2022 Medical Rec #:  [id number]       Height:       63.6 in Accession #:    [id number]      Weight:       238.6 lb Date of Birth:  1956-04-16      BSA:          2.098 m Patient Age:    65 years        BP:           176/78 mmHg Patient Gender: M               HR:           75 bpm. Exam Location:  [address]  Procedure: 2D Echo, Cardiac Doppler and Color Doppler  Indications:    R01.1 Murmur  History:        Patient has no prior history of Echocardiogram examinations. Risk Factors:Hypertension, Sleep Apnea and Diabetes.  Sonographer:    Juventino Oppenheim RCS Referring Phys: [id number] RAJAN R [facility]  IMPRESSIONS   1. Left ventricular ejection fraction, by estimation, is >75%. The left ventricle has hyperdynamic function. The left ventricle has no regional wall motion abnormalities. There is moderate asymmetric left ventricular hypertrophy of the basal-septal segment. Left ventricular diastolic parameters are consistent with Grade I diastolic dysfunction (impaired relaxation). 2. Right ventricular systolic function is normal. The right ventricular size is normal. There is normal pulmonary artery  systolic pressure. The estimated right ventricular systolic pressure is 15.7 mmHg. 3. The mitral valve is grossly normal. Trivial mitral valve regurgitation. 4. The aortic valve is tricuspid. Aortic valve regurgitation is not visualized. 5. The inferior vena cava is normal in size with greater than 50% respiratory variability, suggesting right atrial pressure of 3 mmHg.  Comparison(s): No prior Echocardiogram.  FINDINGS Left Ventricle: Left ventricular ejection fraction, by estimation, is >75%. The left ventricle has hyperdynamic function. The left ventricle has no regional wall motion abnormalities. The left ventricular internal cavity size was normal in size. There is moderate asymmetric left ventricular hypertrophy of the basal-septal segment. Left ventricular  diastolic parameters are consistent with Grade I diastolic dysfunction (impaired relaxation). Indeterminate filling pressures.  Right Ventricle: The right ventricular size is normal. No increase in right ventricular wall thickness. Right ventricular systolic function is normal. There is normal pulmonary artery systolic pressure. The tricuspid regurgitant velocity is 1.78 m/s, and with an assumed right atrial pressure of 3 mmHg, the estimated right ventricular systolic pressure is 15.7 mmHg.  Left Atrium: Left atrial size was normal in size.  Right Atrium: Right atrial size was normal in size.  Pericardium: There is no evidence of pericardial effusion.  Mitral Valve: The mitral valve is grossly normal. Trivial mitral valve regurgitation.  Tricuspid Valve: The tricuspid valve is grossly normal. Tricuspid valve regurgitation is trivial.  Aortic Valve: The aortic valve is tricuspid. Aortic valve regurgitation is not visualized.  Pulmonic Valve: The pulmonic valve was grossly normal. Pulmonic valve regurgitation is trivial.  Aorta: The aortic root and ascending aorta are structurally normal, with no evidence of dilitation.  Venous:  The inferior vena cava is normal in size with greater than 50% respiratory variability, suggesting right atrial pressure of 3 mmHg.  IAS/Shunts: No atrial level shunt detected by color flow Doppler.   LEFT VENTRICLE PLAX 2D LVIDd:         4.50 cm   Diastology LVIDs:         2.80 cm   LV e' medial:    7.72 cm/s LV PW:         1.10 cm   LV E/e' medial:  13.0 LV IVS:        1.40 cm   LV e' lateral:   7.83 cm/s LVOT diam:     1.80 cm   LV E/e' lateral: 12.8 LV SV:         71 LV SV Index:   34 LVOT Area:     2.54 cm   RIGHT VENTRICLE RV Basal diam:  4.20 cm RV Mid diam:    3.40 cm RV S prime:     24.20 cm/s TAPSE (M-mode): 3.2 cm RVSP:           15.7 mmHg  LEFT ATRIUM             Index        RIGHT ATRIUM           Index LA diam:        4.50 cm 2.14 cm/m   RA Pressure: 3.00 mmHg LA Vol (A2C):   42.3 ml 20.16 ml/m  RA Area:     14.60 cm LA Vol (A4C):   50.7 ml 24.16 ml/m  RA Volume:   32.10 ml  15.30 ml/m LA Biplane Vol: 47.2 ml 22.49 ml/m AORTIC VALVE LVOT Vmax:   128.00 cm/s LVOT Vmean:  91.900 cm/s LVOT VTI:    0.278 m  AORTA Ao Root diam: 3.40 cm Ao Asc diam:  3.20 cm  MITRAL VALVE                TRICUSPID VALVE MV Area (PHT):              TR Peak grad:   12.7 mmHg MV Decel Time:              TR Vmax:        178.00 cm/s MV E velocity: 100.00 cm/s  Estimated RAP:  3.00 mmHg MV A velocity: 105.00 cm/s  RVSP:           15.7 mmHg MV  E/A ratio:  0.95 SHUNTS Systemic VTI:  0.28 m Systemic Diam: 1.80 cm  Dinah Franco MD Electronically signed by Dinah Franco MD Signature Date/Time: 03/24/2022/7:17:24 PM    Final      CT SCANS  CT CARDIAC SCORING (SELF PAY ONLY) 03/24/2022  Addendum 03/25/2022  9:44 AM ADDENDUM REPORT: 03/25/2022 09:41  EXAM: OVER-READ INTERPRETATION  CT CHEST  The following report is an over-read performed by radiologist Dr. Pearlean Botts [facility] on 03/25/2022. This over-read does not include interpretation of  cardiac or coronary anatomy or pathology. The interpretation by the cardiologist is attached.  COMPARISON:  None.  FINDINGS: Limited view of the lung parenchyma demonstrates no suspicious nodularity. Airways are normal.  Limited view of the mediastinum demonstrates no adenopathy. Esophagus normal.  Limited view of the upper abdomen unremarkable.  Limited view of the skeleton and chest wall is unremarkable.  IMPRESSION: No significant extracardiac findings.   Electronically Signed By: Deboraha Fallow M.D. On: 03/25/2022 09:41  Narrative CLINICAL DATA:  Cardiovascular Disease Risk stratification  EXAM: Coronary Calcium  Score  TECHNIQUE: A gated, non-contrast computed tomography scan of the heart was performed using 3mm slice thickness. Axial images were analyzed on a dedicated workstation. Calcium  scoring of the coronary arteries was performed using the Agatston method.  FINDINGS: Coronary Calcium  Score:  Left main: 0  Left anterior descending artery: 28.7  Left circumflex artery: 0  Right coronary artery: 0  Total: 28.7  Percentile: 42  Pericardium: Normal.  Ascending Aorta: Normal caliber.  Aortic atherosclerosis.  Non-cardiac: See separate report from [facility].  IMPRESSION: Coronary calcium  score of 28.7. This was 55 percentile for age-, race-, and sex-matched controls.  Aortic atherosclerosis.  RECOMMENDATIONS: Coronary artery calcium  (CAC) score is a strong predictor of incident coronary heart disease (CHD) and provides predictive information beyond traditional risk factors. CAC scoring is reasonable to use in the decision to withhold, postpone, or initiate statin therapy in intermediate-risk or selected borderline-risk asymptomatic adults (age 59-75 years and LDL-C >=70 to <190 mg/dL) who do not have diabetes or established atherosclerotic cardiovascular disease (ASCVD).* In intermediate-risk (10-year ASCVD risk >=7.5% to <20%)  adults or selected borderline-risk (10-year ASCVD risk >=5% to <7.5%) adults in whom a CAC score is measured for the purpose of making a treatment decision the following recommendations have been made:  If CAC=0, it is reasonable to withhold statin therapy and reassess in 5 to 10 years, as long as higher risk conditions are absent (diabetes mellitus, family history of premature CHD in first degree relatives (males <55 years; females <65 years), cigarette smoking, or LDL >=190 mg/dL).  If CAC is 1 to 99, it is reasonable to initiate statin therapy for patients >=52 years of age.  If CAC is >=100 or >=75th percentile, it is reasonable to initiate statin therapy at any age.  Cardiology referral should be considered for patients with CAC scores >=400 or >=75th percentile.  *2018 AHA/ACC/AACVPR/AAPA/ABC/ACPM/ADA/AGS/APhA/ASPC/NLA/PCNA Guideline on the Management of Blood Cholesterol: A Report of the American College of Cardiology/American Heart Association Task Force on Clinical Practice Guidelines. J Am Coll Cardiol. 2019;73(24):3168-3209.  Jacob Angel, MD  Electronically Signed: By: Jacob Crawford M.D. On: 03/24/2022 17:33     ______________________________________________________________________________________________      Risk Assessment/Calculations        Physical Exam VS:  BP (!) 130/90   Pulse 78   Ht 5' 3.45" (1.612 m)   Wt 192 lb (87.1 kg)   SpO2 96%   BMI 33.53 kg/m  Wt Readings from Last 3 Encounters:  04/14/24 192 lb (87.1 kg)  04/07/24 197 lb 6.4 oz (89.5 kg)  03/31/24 198 lb (89.8 kg)    GEN: Well nourished, well developed in no acute distress NECK: No JVD; No carotid bruits CARDIAC: RRR, no murmurs, rubs, gallops RESPIRATORY:  Clear to auscultation without rales, wheezing or rhonchi  ABDOMEN: Soft, non-tender, non-distended EXTREMITIES:  No edema; No deformity   ASSESSMENT AND PLAN Chest pain of uncertain etiology -episode as outlined  above, concerning for angina however workup was completely unrevealing for contributory causes.  Possible he was experiencing some type of viral episode as well as hypertensive episode?  However, he does have mild CAD per prior calcium  score and I think we need to evaluate his episode further so we will arrange for coronary CTA and premedicate with 100 mg of metoprolol  tartrate prior to his exam.  Creatinine on 03/31/2024 0.83, GFR greater than 60.  CAD -mild nonobstructive per calcium  score, will arrange for coronary CTA for more definitive information.  Continue aspirin  81 mg daily, continue Crestor  5 mg daily.  HTN -blood pressure slightly elevated today 130/90, he checks it at home, his son is also a Publishing rights manager who keeps a close eye on him as well.  Continue Diovan  320 mg daily, continue HCTZ 12.5 mg daily, continue Norvasc  5 mg daily. Will continue to monitor for now as it was recently well controlled in light of his weight loss and he was actually able to stop his hydrochlorothiazide .   Dyslipidemia -his cholesterol is actually excellently controlled and always has been however he was started on a low-dose of rosuvastatin  following his calcium  score revealing mild CAD. His LDL is 38, he is on Crestor  5 mg daily.  We did discuss checking an APO B and LPA, he would like to check with his insurance to see if this is covered and will let us  know if he would like to proceed with testing.  Obesity-BMI 33.53, he has lost a significant amount of weight, he is working with a program that is focusing on weight loss, no exercise is currently recommended but he plans to implement this in the future.  Congratulated him on this achievement.       Dispo: Coronary CTA, premedicate with 100 mg of metoprolol  tartrate prior to exam.  Follow-up with Dr. Lafayette Pierre in [place].   Signed, Terrance Ferretti, NP

## 2024-04-14 ENCOUNTER — Encounter: Payer: Self-pay | Admitting: Cardiology

## 2024-04-14 ENCOUNTER — Ambulatory Visit: Attending: Cardiology | Admitting: Cardiology

## 2024-04-14 VITALS — BP 130/90 | HR 78 | Ht 63.45 in | Wt 192.0 lb

## 2024-04-14 DIAGNOSIS — R079 Chest pain, unspecified: Secondary | ICD-10-CM

## 2024-04-14 DIAGNOSIS — E785 Hyperlipidemia, unspecified: Secondary | ICD-10-CM

## 2024-04-14 DIAGNOSIS — I10 Essential (primary) hypertension: Secondary | ICD-10-CM

## 2024-04-14 DIAGNOSIS — E1169 Type 2 diabetes mellitus with other specified complication: Secondary | ICD-10-CM | POA: Diagnosis not present

## 2024-04-14 DIAGNOSIS — E119 Type 2 diabetes mellitus without complications: Secondary | ICD-10-CM

## 2024-04-14 DIAGNOSIS — R931 Abnormal findings on diagnostic imaging of heart and coronary circulation: Secondary | ICD-10-CM | POA: Diagnosis not present

## 2024-04-14 DIAGNOSIS — G4733 Obstructive sleep apnea (adult) (pediatric): Secondary | ICD-10-CM

## 2024-04-14 MED ORDER — METOPROLOL TARTRATE 100 MG PO TABS: 100.0000 mg | ORAL_TABLET | Freq: Once | ORAL | 0 refills | Status: DC

## 2024-04-14 NOTE — Patient Instructions (Signed)
 Medication Instructions:  Your physician recommends that you continue on your current medications as directed. Please refer to the Current Medication list given to you today.  *If you need a refill on your cardiac medications before your next appointment, please call your pharmacy*  Lab Work: None If you have labs (blood work) drawn today and your tests are completely normal, you will receive your results only by: MyChart Message (if you have MyChart) OR A paper copy in the mail If you have any lab test that is abnormal or we need to change your treatment, we will call you to review the results.  Testing/Procedures:   Your cardiac CT will be scheduled at one of the below locations:   [facility] [address] [place], [place] [address] [phone]  OR  [facility] [address] [place], [place] [address] [phone]  OR   [facility] [address] [place], [place] [address] [phone]  OR   [facility] [address] [place], [place] [address] [phone]  OR   [facility] V[facility] Tower [address]  [place], [place] [address] Opening March 06, 2024  If scheduled at [facility], please arrive at the [facility] and Children's Entrance (Entrance C2) of [facility] 30 minutes prior to test start time. You can use the FREE valet parking offered at entrance C (encouraged to control the heart rate for the test)  Proceed to the [facility] Radiology Department (first floor) to check-in and test prep.   All radiology patients and guests should use entrance C2 at [facility], accessed from [facility], even though the hospital's physical address listed is [address]    If scheduled at the Heart and Vascular Tower at [address], please enter the parking lot using the [address] entrance and use  the FREE valet service at the patient drop-off area. Enter the buidling and check-in with registration on the main floor.  If scheduled at [facility] or [facility], please arrive 15 mins early for check-in and test prep.  There is spacious parking and easy access to the radiology department from the [facility] Heart and Vascular entrance. Please enter here and check-in with the desk attendant.   If scheduled at [facility], please arrive 30 minutes early for check-in and test prep.  Please follow these instructions carefully (unless otherwise directed):  An IV will be required for this test and Nitroglycerin will be given.  Hold all erectile dysfunction medications at least 3 days (72 hrs) prior to test. (Ie viagra , cialis, sildenafil , tadalafil, etc)   On the Night Before the Test: Be sure to Drink plenty of water. Do not consume any caffeinated/decaffeinated beverages or chocolate 12 hours prior to your test. Do not take any antihistamines 12 hours prior to your test.  On the Day of the Test: Drink plenty of water until 1 hour prior to the test. Do not eat any food 1 hour prior to test. You may take your regular medications prior to the test.  Take metoprolol (Lopressor) two hours prior to test. If you take Hydrochlorothiazide  please HOLD on the morning of the test. Patients who wear a continuous glucose monitor MUST remove the device prior to scanning.      After the Test: Drink plenty of water. After receiving IV contrast, you may experience a mild flushed  feeling. This is normal. On occasion, you may experience a mild rash up to 24 hours after the test. This is not dangerous. If this occurs, you can take Benadryl 25 mg, Zyrtec, Claritin, or Allegra and increase your fluid intake. (Patients taking Tikosyn should avoid Benadryl, and may take Zyrtec, Claritin, or Allegra) If you experience trouble breathing, this can be serious. If  it is severe call 911 IMMEDIATELY. If it is mild, please call our office.  We will call to schedule your test 2-4 weeks out understanding that some insurance companies will need an authorization prior to the service being performed.   For more information and frequently asked questions, please visit our website : [email or web address]  For non-scheduling related questions, please contact the cardiac imaging nurse navigator should you have any questions/concerns: Cardiac Imaging Nurse Navigators Direct Office Dial: [phone]   For scheduling needs, including cancellations and rescheduling, please call [place], [phone].   Follow-Up: At [facility], you and your health needs are our priority.  As part of our continuing mission to provide you with exceptional heart care, our providers are all part of one team.  This team includes your primary Cardiologist (physician) and Advanced Practice Providers or APPs (Physician Assistants and Nurse Practitioners) who all work together to provide you with the care you need, when you need it.  Your next appointment:   6 month(s)  Provider:   Hillis Lu, MD    We recommend signing up for the patient portal called "MyChart".  Sign up information is provided on this After Visit Summary.  MyChart is used to connect with patients for Virtual Visits (Telemedicine).  Patients are able to view lab/test results, encounter notes, upcoming appointments, etc.  Non-urgent messages can be sent to your provider as well.   To learn more about what you can do with MyChart, go to [email or web address].   Other Instructions   **The test I would recommend would be LP(a) and APO B **  **Tell Christian I said hello!!**

## 2024-04-18 ENCOUNTER — Encounter: Payer: Self-pay | Admitting: Family Medicine

## 2024-04-18 DIAGNOSIS — L989 Disorder of the skin and subcutaneous tissue, unspecified: Secondary | ICD-10-CM

## 2024-04-18 NOTE — Telephone Encounter (Signed)
 Patient had a visit 04/07/2024 do you want another visit to discuss this referral specifically?

## 2024-04-24 ENCOUNTER — Other Ambulatory Visit: Payer: Self-pay | Admitting: Pharmacist

## 2024-04-24 ENCOUNTER — Encounter: Payer: Self-pay | Admitting: Pharmacist

## 2024-04-24 ENCOUNTER — Other Ambulatory Visit: Payer: Self-pay | Admitting: Family Medicine

## 2024-04-24 MED ORDER — VALSARTAN 320 MG PO TABS: 320.0000 mg | ORAL_TABLET | Freq: Every day | ORAL | 0 refills | Status: DC

## 2024-04-24 NOTE — Progress Notes (Signed)
 Pharmacy Quality Measure Review  This patient is appearing on a report for being at risk of failing the adherence measure for hypertension (ACEi/ARB) medications this calendar year.   Medication: valsartan  320mg  Last fill date: 02/26/2024 for 30 day supply per adherence report but patient has Rx for 30 Days filled 03/24/2024. He does not have current Rx with refill remaining and is due to have filled soon.   Will collaborate with provider to facilitate refill needs. Refill request sent to PCP.  Cecilie Coffee, PharmD Clinical Pharmacist [facility]  Population Health [phone]

## 2024-04-25 NOTE — Telephone Encounter (Signed)
 Patient is asking about a dermatology referral but there is not one placed please advise should I have patient return for new referral?

## 2024-04-25 NOTE — Telephone Encounter (Signed)
 Carla: I see patient was referred on 04/07/2024 to Gastroenterology, is there anything that we are waiting on?

## 2024-04-28 ENCOUNTER — Encounter: Payer: Self-pay | Admitting: Gastroenterology

## 2024-05-05 ENCOUNTER — Encounter (HOSPITAL_COMMUNITY): Payer: Self-pay

## 2024-05-08 ENCOUNTER — Telehealth (HOSPITAL_COMMUNITY): Payer: Self-pay | Admitting: *Deleted

## 2024-05-08 NOTE — Telephone Encounter (Signed)
 Reaching out to patient to offer assistance regarding upcoming cardiac imaging study; pt verbalizes understanding of appt date/time, parking situation and where to check in, pre-test NPO status and medications ordered, and verified current allergies; name and call back number provided for further questions should they arise  Chantal Requena RN Navigator Cardiac Imaging [facility] [phone] office [phone] cell  Patient to take 100mg  metoprolol  tartrate two hours prior to his cardiac CT study.

## 2024-05-09 ENCOUNTER — Ambulatory Visit (HOSPITAL_BASED_OUTPATIENT_CLINIC_OR_DEPARTMENT_OTHER)
Admission: RE | Admit: 2024-05-09 | Discharge: 2024-05-09 | Disposition: A | Discharge status: Home / Self Care | Source: Ambulatory Visit | Attending: Cardiology | Admitting: Cardiology

## 2024-05-09 ENCOUNTER — Ambulatory Visit: Payer: Self-pay | Admitting: Cardiology

## 2024-05-09 DIAGNOSIS — I2510 Atherosclerotic heart disease of native coronary artery without angina pectoris: Secondary | ICD-10-CM | POA: Diagnosis not present

## 2024-05-09 DIAGNOSIS — R079 Chest pain, unspecified: Secondary | ICD-10-CM | POA: Diagnosis not present

## 2024-05-09 MED ORDER — IOHEXOL 350 MG/ML SOLN
100.0000 mL | Freq: Once | INTRAVENOUS | Status: AC | PRN
  Administered 2024-05-09: 100 mL via INTRAVENOUS

## 2024-05-09 MED ORDER — NITROGLYCERIN 0.4 MG SL SUBL
0.8000 mg | SUBLINGUAL_TABLET | Freq: Once | SUBLINGUAL | Status: AC
  Administered 2024-05-09: 0.8 mg via SUBLINGUAL

## 2024-05-11 LAB — HM DIABETES EYE EXAM

## 2024-05-15 ENCOUNTER — Other Ambulatory Visit: Payer: Self-pay | Admitting: Family Medicine

## 2024-05-15 ENCOUNTER — Encounter: Payer: Self-pay | Admitting: Family Medicine

## 2024-05-15 NOTE — Telephone Encounter (Signed)
 Patient response

## 2024-06-09 ENCOUNTER — Encounter

## 2024-06-16 ENCOUNTER — Ambulatory Visit: Admitting: Family Medicine

## 2024-06-16 ENCOUNTER — Encounter: Payer: Self-pay | Admitting: Family Medicine

## 2024-06-16 VITALS — BP 130/80 | HR 68 | Temp 98.0°F | Ht 63.5 in | Wt 199.0 lb

## 2024-06-16 DIAGNOSIS — I10 Essential (primary) hypertension: Secondary | ICD-10-CM | POA: Diagnosis not present

## 2024-06-16 DIAGNOSIS — R1013 Epigastric pain: Secondary | ICD-10-CM | POA: Diagnosis not present

## 2024-06-16 NOTE — Progress Notes (Signed)
   Subjective:    Patient ID: Jacob Crawford, male    DOB: 05-21-1956, 68 y.o.   MRN: [id number]  HPI HTN- chronic problem.  On Amlodipine  5mg  daily, hydrochlorothiazide  12.5mg  daily, Valsartan  320mg  daily w/ good control.  Denies CP, SOB, HA's, visual changes, edema.  Abd pain- sxs started ~1 week ago.  Has been under considerable stress and was taking ibuprofen for joint pains.  Pain is worse w/ food.  Described as a burning or gnawing pain.  Currently taking Pepcid prn.  Took benytl w/ some relief.     Review of Systems For ROS see HPI     Objective:   Physical Exam Vitals reviewed.  Constitutional:      General: He is not in acute distress.    Appearance: Normal appearance. He is well-developed. He is obese. He is not ill-appearing.  HENT:     Head: Normocephalic and atraumatic.  Eyes:     Extraocular Movements: Extraocular movements intact.     Conjunctiva/sclera: Conjunctivae normal.     Pupils: Pupils are equal, round, and reactive to light.  Neck:     Thyroid : No thyromegaly.  Cardiovascular:     Rate and Rhythm: Normal rate and regular rhythm.     Pulses: Normal pulses.     Heart sounds: Normal heart sounds. No murmur heard. Pulmonary:     Effort: Pulmonary effort is normal. No respiratory distress.     Breath sounds: Normal breath sounds.  Abdominal:     General: Bowel sounds are normal. There is no distension.     Palpations: Abdomen is soft.  Musculoskeletal:     Cervical back: Normal range of motion and neck supple.     Right lower leg: No edema.     Left lower leg: No edema.  Lymphadenopathy:     Cervical: No cervical adenopathy.  Skin:    General: Skin is warm and dry.  Neurological:     General: No focal deficit present.     Mental Status: He is alert and oriented to person, place, and time.     Cranial Nerves: No cranial nerve deficit.  Psychiatric:        Mood and Affect: Mood normal.        Behavior: Behavior normal.           Assessment  & Plan:

## 2024-06-16 NOTE — Patient Instructions (Addendum)
 Follow up as needed or as scheduled No need for labs today- yay!!! Continue to work on healthy diet and regular exercise- you can do it! No med changes today- BP looks great! Take the Prevacid daily until abdominal pain is improving Try and limit spicy, acidic foods and caffeine Call with any questions or concerns Stay Safe!  Stay Healthy! GOOD LUCK!!

## 2024-06-26 ENCOUNTER — Other Ambulatory Visit: Payer: Self-pay | Admitting: Family Medicine

## 2024-07-01 DIAGNOSIS — B9689 Other specified bacterial agents as the cause of diseases classified elsewhere: Secondary | ICD-10-CM | POA: Diagnosis not present

## 2024-07-01 DIAGNOSIS — J209 Acute bronchitis, unspecified: Secondary | ICD-10-CM | POA: Diagnosis not present

## 2024-07-01 DIAGNOSIS — J019 Acute sinusitis, unspecified: Secondary | ICD-10-CM | POA: Diagnosis not present

## 2024-07-07 ENCOUNTER — Encounter: Admitting: Gastroenterology

## 2024-07-09 NOTE — Assessment & Plan Note (Signed)
 Chronic problem.  Currently on Amlodipine , hydrochlorothiazide , and Valsartan  w/ good control.  Currently asymptomatic.  No need for repeat labs at this time.  Will follow.

## 2024-07-09 NOTE — Assessment & Plan Note (Signed)
 New.  Sxs started ~1 week ago.  Under high stress, taking ibuprofen.  Pain is worse w/ eating.  Some relief w/ Pepcid PRN.  Encouraged him to take the Famotidine daily until sxs improve.  Also discussed dietary modifications to improve sxs.  Pt expressed understanding and is in agreement w/ plan.

## 2024-07-13 ENCOUNTER — Encounter: Payer: Self-pay | Admitting: Pharmacist

## 2024-07-13 NOTE — Progress Notes (Signed)
 Pharmacy Quality Measure Review  This patient is appearing on a report for being at risk of failing the adherence measure for cholesterol (statin) medications this calendar year.   Medication: rosuvastatin   Last fill date: 03/24/2024 for 90 day supply  Reviewed recent refill history in Dr Annemarie database. Actual last refill date was 06/26/2024 for 90 day supply. Patient has 1 refill remaining. Next appointment with PCP is 08/25/2024.   Insurance report was not up to date. No action needed at this time.   Madelin Ray, PharmD Clinical Pharmacist [facility]  Population Health [phone]

## 2024-08-19 ENCOUNTER — Other Ambulatory Visit: Payer: Self-pay | Admitting: Family Medicine

## 2024-08-25 ENCOUNTER — Encounter: Payer: Self-pay | Admitting: Family Medicine

## 2024-08-25 ENCOUNTER — Ambulatory Visit: Admitting: Family Medicine

## 2024-08-25 VITALS — BP 134/82 | HR 73 | Temp 98.5°F | Ht 63.5 in | Wt 205.8 lb

## 2024-08-25 DIAGNOSIS — I10 Essential (primary) hypertension: Secondary | ICD-10-CM | POA: Diagnosis not present

## 2024-08-25 DIAGNOSIS — Z23 Encounter for immunization: Secondary | ICD-10-CM

## 2024-08-25 DIAGNOSIS — E1169 Type 2 diabetes mellitus with other specified complication: Secondary | ICD-10-CM | POA: Diagnosis not present

## 2024-08-25 DIAGNOSIS — E785 Hyperlipidemia, unspecified: Secondary | ICD-10-CM | POA: Diagnosis not present

## 2024-08-25 DIAGNOSIS — E119 Type 2 diabetes mellitus without complications: Secondary | ICD-10-CM | POA: Diagnosis not present

## 2024-08-25 NOTE — Patient Instructions (Addendum)
Schedule your complete physical in 6 months We'll notify you of your lab results and make any changes if needed Continue to work on healthy diet and regular exercise- you can do it! Call with any questions or concerns Stay Safe!  Stay Healthy! Happy Fall!!! 

## 2024-08-25 NOTE — Progress Notes (Unsigned)
   Subjective:    Patient ID: Jacob Crawford, male    DOB: 08/12/1956, 68 y.o.   MRN: [id number]  HPI HTN- chronic problem, on Valsartan  320mg  daily, hydrochlorothiazide  12.5mg  daily, Amlodipine  5mg  daily w/ adequate control.  No CP, SOB, HA's, visual changes, edema.  Hyperlipidemia- chronic problem, on Crestor  5mg  daily.  No abd pain, N/V.  DM- chronic problem.  Currently controlled w/ diet and exercise. Last A1C 5.3%  denies symptomatic lows.  No numbness/tingling of hands/feet.  UTD on eye exam, foot exam, microalbumin  Obesity- pt has gained 7 lbs since August. BMI 35.88   Review of Systems For ROS see HPI     Objective:   Physical Exam Vitals reviewed.  Constitutional:      General: He is not in acute distress.    Appearance: Normal appearance. He is well-developed. He is obese. He is not ill-appearing.  HENT:     Head: Normocephalic and atraumatic.  Eyes:     Extraocular Movements: Extraocular movements intact.     Conjunctiva/sclera: Conjunctivae normal.     Pupils: Pupils are equal, round, and reactive to light.  Neck:     Thyroid : No thyromegaly.  Cardiovascular:     Rate and Rhythm: Normal rate and regular rhythm.     Pulses: Normal pulses.     Heart sounds: Normal heart sounds. No murmur heard. Pulmonary:     Effort: Pulmonary effort is normal. No respiratory distress.     Breath sounds: Normal breath sounds.  Abdominal:     General: Bowel sounds are normal. There is no distension.     Palpations: Abdomen is soft.  Musculoskeletal:     Cervical back: Normal range of motion and neck supple.     Right lower leg: No edema.     Left lower leg: No edema.  Lymphadenopathy:     Cervical: No cervical adenopathy.  Skin:    General: Skin is warm and dry.  Neurological:     General: No focal deficit present.     Mental Status: He is alert and oriented to person, place, and time.     Cranial Nerves: No cranial nerve deficit.  Psychiatric:        Mood and Affect:  Mood normal.        Behavior: Behavior normal.           Assessment & Plan:

## 2024-08-26 LAB — CBC WITH DIFFERENTIAL/PLATELET
Absolute Lymphocytes: 1501 {cells}/uL (ref 850–3900)
Absolute Monocytes: 458 {cells}/uL (ref 200–950)
Basophils Absolute: 31 {cells}/uL (ref 0–200)
Basophils Relative: 0.5 %
Eosinophils Absolute: 128 {cells}/uL (ref 15–500)
Eosinophils Relative: 2.1 %
HCT: 45.6 % (ref 38.5–50.0)
Hemoglobin: 15.4 g/dL (ref 13.2–17.1)
MCH: 31.2 pg (ref 27.0–33.0)
MCHC: 33.8 g/dL (ref 32.0–36.0)
MCV: 92.3 fL (ref 80.0–100.0)
MPV: 10.4 fL (ref 7.5–12.5)
Monocytes Relative: 7.5 %
Neutro Abs: 3983 {cells}/uL (ref 1500–7800)
Neutrophils Relative %: 65.3 %
Platelets: 196 Thousand/uL (ref 140–400)
RBC: 4.94 Million/uL (ref 4.20–5.80)
RDW: 11.9 % (ref 11.0–15.0)
Total Lymphocyte: 24.6 %
WBC: 6.1 Thousand/uL (ref 3.8–10.8)

## 2024-08-26 LAB — BASIC METABOLIC PANEL WITH GFR
BUN/Creatinine Ratio: 27 (calc) — ABNORMAL HIGH (ref 6–22)
BUN: 27 mg/dL — ABNORMAL HIGH (ref 7–25)
CO2: 24 mmol/L (ref 20–32)
Calcium: 9.4 mg/dL (ref 8.6–10.3)
Chloride: 106 mmol/L (ref 98–110)
Creat: 1.01 mg/dL (ref 0.70–1.35)
Glucose, Bld: 76 mg/dL (ref 65–99)
Potassium: 3.9 mmol/L (ref 3.5–5.3)
Sodium: 140 mmol/L (ref 135–146)
eGFR: 83 mL/min/1.73m2 (ref >=60)

## 2024-08-26 LAB — LIPID PANEL
Cholesterol: 102 mg/dL (ref <200)
HDL: 39 mg/dL — ABNORMAL LOW (ref >=40)
LDL Cholesterol (Calc): 43 mg/dL
Non-HDL Cholesterol (Calc): 63 mg/dL (ref <130)
Total CHOL/HDL Ratio: 2.6 (calc) (ref <5.0)
Triglycerides: 110 mg/dL (ref <150)

## 2024-08-26 LAB — HEPATIC FUNCTION PANEL
AG Ratio: 1.8 (calc) (ref 1.0–2.5)
ALT: 26 U/L (ref 9–46)
AST: 20 U/L (ref 10–35)
Albumin: 4.6 g/dL (ref 3.6–5.1)
Alkaline phosphatase (APISO): 63 U/L (ref 35–144)
Bilirubin, Direct: 0.2 mg/dL (ref 0.0–0.2)
Globulin: 2.5 g/dL (ref 1.9–3.7)
Indirect Bilirubin: 0.5 mg/dL (ref 0.2–1.2)
Total Bilirubin: 0.7 mg/dL (ref 0.2–1.2)
Total Protein: 7.1 g/dL (ref 6.1–8.1)

## 2024-08-26 LAB — HEMOGLOBIN A1C
Hgb A1c MFr Bld: 5.1 % (ref <5.7)
Mean Plasma Glucose: 100 mg/dL
eAG (mmol/L): 5.5 mmol/L

## 2024-08-26 LAB — TSH: TSH: 2.45 m[IU]/L (ref 0.40–4.50)

## 2024-08-27 NOTE — Assessment & Plan Note (Signed)
 Chronic problem.  On Valsartan , HCtZ, and Amlodipine  w/ adequate control.  Currently asymptomatic.  Check labs due to ARB and diuretic use but no anticipated med changes.  Will follow.

## 2024-08-27 NOTE — Assessment & Plan Note (Signed)
 Chronic problem.  Currently controlled w/ diet and exercise.  Last A1C 5.3%.  UTD on eye exam, foot exam, microalbumin.  Check labs.  Start meds prn.

## 2024-08-27 NOTE — Assessment & Plan Note (Signed)
 Chronic problem.  On Crestor 5mg  daily w/o difficulty.  Check labs.  Adjust meds prn

## 2024-08-27 NOTE — Assessment & Plan Note (Signed)
 Ongoing issue.  Pt has gained 7 lbs since August.  BMI now 35.88 and qualifies as morbidly obese due to other medical issues.  Encouraged low carb diet, regular exercise.  Will follow.

## 2024-08-28 ENCOUNTER — Ambulatory Visit: Payer: Self-pay | Admitting: Family Medicine

## 2024-08-28 ENCOUNTER — Encounter: Payer: Self-pay | Admitting: Dermatology

## 2024-08-28 ENCOUNTER — Ambulatory Visit: Admitting: Dermatology

## 2024-08-28 VITALS — BP 134/82 | HR 72

## 2024-08-28 DIAGNOSIS — D489 Neoplasm of uncertain behavior, unspecified: Secondary | ICD-10-CM

## 2024-08-28 DIAGNOSIS — D044 Carcinoma in situ of skin of scalp and neck: Secondary | ICD-10-CM | POA: Diagnosis not present

## 2024-08-28 DIAGNOSIS — L570 Actinic keratosis: Secondary | ICD-10-CM

## 2024-08-28 DIAGNOSIS — D0439 Carcinoma in situ of skin of other parts of face: Secondary | ICD-10-CM | POA: Diagnosis not present

## 2024-08-28 DIAGNOSIS — Z85828 Personal history of other malignant neoplasm of skin: Secondary | ICD-10-CM

## 2024-08-28 DIAGNOSIS — D099 Carcinoma in situ, unspecified: Secondary | ICD-10-CM

## 2024-08-28 DIAGNOSIS — W908XXA Exposure to other nonionizing radiation, initial encounter: Secondary | ICD-10-CM | POA: Diagnosis not present

## 2024-08-28 DIAGNOSIS — L578 Other skin changes due to chronic exposure to nonionizing radiation: Secondary | ICD-10-CM | POA: Diagnosis not present

## 2024-08-28 HISTORY — DX: Carcinoma in situ, unspecified: D09.9

## 2024-08-28 NOTE — Progress Notes (Signed)
 New Patient Visit   Subjective  Jacob Crawford is a 68 y.o. male who presents for the following: spot check  Spot check on the forehead and left cheek that he would like to have examined.   Patient reports the cheek spot was noticed around a week ago. Patient reports that it becomes itchy and flaky  Patient reports the spot on the forehead has been there for 5 months Patient reports that the spot is sometimes tender.  Patient states that he sometimes places vaseline on the spots  Patient reports that he has has Aks treated in the past Patient reports that he was treated for a BCC on left forehead around 10 years ago. Patient reports family hx of skin cancer (Father, unsure of dx)  Patient states that he uses an exfoliating face wash 5 times a week.  The patient has spots, moles and lesions to be evaluated, some may be new or changing and the patient may have concern these could be cancer.  The following portions of the chart were reviewed this encounter and updated as appropriate: medications, allergies, medical history  Review of Systems:  No other skin or systemic complaints except as noted in HPI or Assessment and Plan.  Objective   A focused examination was performed of the following areas: Face and forehead  Relevant exam findings are noted in the Assessment and Plan.      Head - Anterior (Face) 4mm hemorrhagic crusted papule  Left Eyebrow, Left Zygomatic Area Erythematous thin papules/macules with gritty scale.   Assessment & Plan   HISTORY OF BASAL CELL CARCINOMA OF THE SKIN - No evidence of recurrence today - Recommend regular full body skin exams - Recommend daily broad spectrum sunscreen SPF 30+ to sun-exposed areas, reapply every 2 hours as needed.  - Call if any new or changing lesions are noted between office visits  ACTINIC DAMAGE - chronic, secondary to cumulative UV radiation exposure/sun exposure over time - diffuse scaly erythematous macules  with underlying dyspigmentation - Recommend daily broad spectrum sunscreen SPF 30+ to sun-exposed areas, reapply every 2 hours as needed.  - Recommend staying in the shade or wearing long sleeves, sun glasses (UVA+UVB protection) and wide brim hats (4-inch brim around the entire circumference of the hat). - Call for new or changing lesions.  NEOPLASM OF UNCERTAIN BEHAVIOR Head - Anterior (Face) Epidermal / dermal shaving  Lesion diameter (cm):  0.4 Informed consent: discussed and consent obtained   Timeout: patient name, date of birth, surgical site, and procedure verified   Procedure prep:  Patient was prepped and draped in usual sterile fashion Prep type:  Isopropyl alcohol Anesthesia: the lesion was anesthetized in a standard fashion   Anesthetic:  1% lidocaine w/ epinephrine 1-100,000 buffered w/ 8.4% NaHCO3 Instrument used: DermaBlade   Hemostasis achieved with: aluminum chloride   Outcome: patient tolerated procedure well   Post-procedure details: wound care instructions given    Specimen A - Surgical pathology Differential Diagnosis: R/O NMSC vs Other  Check Margins: No ACTINIC KERATOSIS (2) Left Eyebrow, Left Zygomatic Area Destruction of lesion - Left Zygomatic Area Complexity: simple   Destruction method: cryotherapy   Informed consent: discussed and consent obtained   Cryotherapy cycles:  2 Outcome: patient tolerated procedure well with no complications   Post-procedure details: wound care instructions given     Return if symptoms worsen or fail to improve.  I, Lyle Cords, as acting as a Neurosurgeon for RUFUS CHRISTELLA HOLY, MD .  Documentation: I have reviewed the above documentation for accuracy and completeness, and I agree with the above.  RUFUS CHRISTELLA HOLY, MD

## 2024-08-28 NOTE — Patient Instructions (Signed)

## 2024-08-30 LAB — SURGICAL PATHOLOGY

## 2024-08-31 ENCOUNTER — Ambulatory Visit: Payer: Self-pay | Admitting: Dermatology

## 2024-10-19 ENCOUNTER — Encounter: Payer: Self-pay | Admitting: Dermatology

## 2024-10-19 ENCOUNTER — Ambulatory Visit: Admitting: Dermatology

## 2024-10-19 VITALS — BP 159/79 | HR 71 | Temp 98.2°F

## 2024-10-19 DIAGNOSIS — L578 Other skin changes due to chronic exposure to nonionizing radiation: Secondary | ICD-10-CM | POA: Diagnosis not present

## 2024-10-19 DIAGNOSIS — D044 Carcinoma in situ of skin of scalp and neck: Secondary | ICD-10-CM

## 2024-10-19 DIAGNOSIS — L814 Other melanin hyperpigmentation: Secondary | ICD-10-CM | POA: Diagnosis not present

## 2024-10-19 DIAGNOSIS — D099 Carcinoma in situ, unspecified: Secondary | ICD-10-CM

## 2024-10-19 NOTE — Progress Notes (Signed)
 Follow-Up Visit   Subjective  Jacob Crawford is a 68 y.o. male who presents for the following: Mohs of of squamous cell carcinoma in situ on the head--anterior, biopsied by Dr. Corey. Lesion as biopsied on 08/28/2024. Patient reports that lesion has been present for 3-4 months and it itchy.  Patient reports a new lesion on the right temple that intially burned, but has improved.   The following portions of the chart were reviewed this encounter and updated as appropriate: medications, allergies, medical history  Review of Systems:  No other skin or systemic complaints except as noted in HPI or Assessment and Plan.  Objective  Well appearing patient in no apparent distress; mood and affect are within normal limits.  A focused examination was performed of the following areas: Head--anterior Relevant physical exam findings are noted in the Assessment and Plan.   head--anterior Pink scaly papule   Assessment & Plan   SQUAMOUS CELL CARCINOMA IN SITU head--anterior - Mohs surgery  Consent obtained: written  Anticoagulation: Is the patient taking prescription anticoagulant and/or aspirin  prescribed/recommended by a physician? No   Was the anticoagulation regimen changed prior to Mohs? No    Anesthesia: Anesthesia method: local infiltration Local anesthetic: lidocaine 1% WITH epi  Procedure Details: Timeout: pre-procedure verification complete Procedure Prep: patient was prepped and draped in usual sterile fashion Prep type: chlorhexidine Biopsy accession number: [id number] Biopsy lab: [facility] Date of biopsy: 08/28/2024 Specimen debulked: No   Pre-Op diagnosis: squamous cell carcinoma SCC subtype: in situ MohsAIQ Surgical site (if tumor spans multiple areas, please select predominant area): forehead (non-eyebrow) Surgery side: right Surgical site (from skin exam): head--anterior Pre-operative length (cm): 1 Pre-operative width (cm): 0.5 Indications for  Mohs surgery: anatomic location where tissue conservation is critical  Micrographic Surgery Details: Post-operative length (cm): 1.5 Post-operative width (cm): 1.1 Number of Mohs stages: 1 Post surgery depth of defect: subcutaneous fat  Stage 1    Tumor features identified on Mohs section: no tumor identified  Patient tolerance of procedure: tolerated well, no immediate complications  Reconstruction: Was the defect reconstructed? Yes   Was reconstruction performed by the same Mohs surgeon? Yes   Setting of reconstruction: outpatient office When was reconstruction performed? same day Type of reconstruction: linear Linear reconstruction: complex  - Skin repair Complexity:  Complex Final length (cm):  3.8 Informed consent: discussed and consent obtained   Timeout: patient name, date of birth, surgical site, and procedure verified   Procedure prep:  Patient was prepped and draped in usual sterile fashion Prep type:  Chlorhexidine Anesthesia: the lesion was anesthetized in a standard fashion   Anesthetic:  1% lidocaine w/ epinephrine 1-100,000 buffered w/ 8.4% NaHCO3 Reason for type of repair: reduce tension to allow closure, allow closure of the large defect and preserve normal anatomy   Undermining: area extensively undermined   Subcutaneous layers (deep stitches):  Suture size:  4-0 Suture type: Vicryl (polyglactin 910)   Stitches:  Buried vertical mattress Fine/surface layer approximation (top stitches):  Suture size:  6-0 Suture type: fast-absorbing plain gut   Stitches: simple running   Hemostasis achieved with: suture, pressure and electrodesiccation Outcome: patient tolerated procedure well with no complications   Post-procedure details: sterile dressing applied and wound care instructions given   Dressing type: bandage and pressure dressing      Return in about 4 weeks (around 11/16/2024) for wound check.   10/19/2024  HISTORY OF PRESENT ILLNESS  Jacob Crawford is seen in consultation  at the request of Dr. Currie Dennin for biopsy-proven Squamous Carcinoma in Situ of the anterior head/forehead. They note that the area has been present for about 6 months increasing in size with time.  There is no history of previous treatment.  Reports no other new or changing lesions and has no other complaints today.  Medications and allergies: see patient chart.  Review of systems: Reviewed 8 systems and notable for the above skin cancer.  All other systems reviewed are unremarkable/negative, unless noted in the HPI. Past medical history, surgical history, family history, social history were also reviewed and are noted in the chart/questionnaire.    PHYSICAL EXAMINATION  General: Well-appearing, in no acute distress, alert and oriented x 4. Vitals reviewed in chart (if available).   Skin: Exam reveals a 1.0 x 0.5 cm erythematous papule and biopsy scar on the anterior head/forehead. There are rhytids, telangiectasias, and lentigines, consistent with photodamage.  Biopsy report(s) reviewed, confirming the diagnosis.   ASSESSMENT  1) Squamous Carcinoma in Situ of the anterior head/forehead 2) photodamage 3) solar lentigines   PLAN   1. Due to location, size, histology, or recurrence and the likelihood of subclinical extension as well as the need to conserve normal surrounding tissue, the patient was deemed acceptable for Mohs micrographic surgery (MMS).  The nature and purpose of the procedure, associated benefits and risks including recurrence and scarring, possible complications such as pain, infection, and bleeding, and alternative methods of treatment if appropriate were discussed with the patient during consent. The lesion location was verified by the patient, by reviewing previous notes, pathology reports, and by photographs as well as angulation measurements if available.  Informed consent was reviewed and signed by the patient, and timeout was performed at 10:30  AM. See op note below.  2. For the photodamage and solar lentigines, sun protection discussed/information given on OTC sunscreens, and we recommend continued regular follow-up with primary dermatologist every 6 months or sooner for any growing, bleeding, or changing lesions. 3. Prognosis and future surveillance discussed. 4. Letter with treatment outcome sent to referring provider. 5. Pain acetaminophen /ibuprofen  MOHS MICROGRAPHIC SURGERY AND RECONSTRUCTION  Initial size:   1.0 x 0.5 cm Surgical defect/wound size: 1.5 x 1.1 cm Anesthesia:    0.33% lidocaine with 1:200,000 epinephrine EBL:    <5 mL Complications:  None Repair type:   Complex SQ suture:   4-0 Vicryl Cutaneous suture:  6-0 Plain gut Final size of the repair: 3.8 cm  Stages: 1  STAGE I: Anesthesia achieved with 0.5% lidocaine with 1:200,000 epinephrine. ChloraPrep applied. 1 section(s) excised using Mohs technique (this includes total peripheral and deep tissue margin excision and evaluation with frozen sections, excised and interpreted by the same physician). The tumor was first debulked and then excised with an approx. 2mm margin.  Hemostasis was achieved with electrocautery as needed.  The specimen was then oriented, subdivided/relaxed, inked, and processed using Mohs technique.    Frozen section analysis revealed a clear deep and peripheral margin.  Reconstruction  The surgical wound was then cleaned, prepped, and re-anesthetized as above. Wound edges were undermined extensively along at least one entire edge and at a distance equal to or greater than the width of the defect (see wound defect size above) in order to achieve closure and decrease wound tension and anatomic distortion. Redundant tissue repair including standing cone removal was performed. Hemostasis was achieved with electrocautery. Subcutaneous and epidermal tissues were approximated with the above sutures. The surgical site was then lightly scrubbed  with  sterile, saline-soaked gauze. The area was then bandaged using Vaseline ointment, non-adherent gauze, gauze pads, and tape to provide an adequate pressure dressing. The patient tolerated the procedure well, was given detailed written and verbal wound care instructions, and was discharged in good condition.   The patient will follow-up: 4 weeks.   Documentation: I have reviewed the above documentation for accuracy and completeness, and I agree with the above.  RUFUS CHRISTELLA HOLY, MD

## 2024-10-19 NOTE — Patient Instructions (Signed)

## 2024-10-23 ENCOUNTER — Other Ambulatory Visit: Payer: Self-pay | Admitting: Family Medicine

## 2024-10-24 ENCOUNTER — Encounter: Payer: Self-pay | Admitting: Dermatology

## 2024-10-24 NOTE — Progress Notes (Signed)
 Pharmacy Quality Measure Review  This patient is appearing on a report for being at risk of failing the adherence measure for cholesterol (statin) medications this calendar year.   Medication: Rosuvastatin  5mg  Last fill date: 12/04 for 90 day supply  Insurance report was not up to date. No action needed at this time.   Momoko Slezak, PharmD [facility] Pharmacist

## 2024-11-14 ENCOUNTER — Other Ambulatory Visit: Payer: Self-pay | Admitting: Family Medicine

## 2024-11-21 ENCOUNTER — Encounter: Payer: Self-pay | Admitting: Dermatology

## 2024-11-21 ENCOUNTER — Ambulatory Visit: Admitting: Dermatology

## 2024-11-21 DIAGNOSIS — L570 Actinic keratosis: Secondary | ICD-10-CM | POA: Diagnosis not present

## 2024-11-21 DIAGNOSIS — D099 Carcinoma in situ, unspecified: Secondary | ICD-10-CM

## 2024-11-21 DIAGNOSIS — Z85828 Personal history of other malignant neoplasm of skin: Secondary | ICD-10-CM

## 2024-11-21 DIAGNOSIS — L905 Scar conditions and fibrosis of skin: Secondary | ICD-10-CM | POA: Diagnosis not present

## 2024-11-21 NOTE — Patient Instructions (Addendum)

## 2024-11-21 NOTE — Progress Notes (Signed)
" ° °  Follow Up Visit   Subjective  Jacob Crawford is a 69 y.o. male who presents for the following: follow up from Mohs surgery   The patient presents for follow up from Mohs surgery for a SCC on the head anterior treated on 10/19/24, repaired with linear closure. The patient has been bandaging the wound as directed. The endorse the following concerns: none  The following portions of the chart were reviewed this encounter and updated as appropriate: medications, allergies, medical history  Review of Systems:  No other skin or systemic complaints except as noted in HPI or Assessment and Plan.  Objective  Well appearing patient in no apparent distress; mood and affect are within normal limits.  A focal examination was performed including scalp, head, face and head--anterior. All findings within normal limits unless otherwise noted below.  Healing wound with mild erythema  Relevant physical exam findings are noted in the Assessment and Plan.  Mid Forehead Erythematous thin papules/macules with gritty scale.   Assessment & Plan   Scar s/p Mohs for SCC on the frontal scalp, treated on 10/19/24, repaired with linear closure - Reassured that wound is healing well - No evidence of infection - No swelling, induration, purulence, dehiscence, or tenderness out of proportion to the clinical exam, see photo above - Discussed that scars take up to 12 months to mature from the date of surgery - Recommend SPF 30+ to scar daily to prevent purple color from UV exposure during scar maturation process - Discussed that erythema and raised appearance of scar will fade over the next 4-6 months - OK to start scar massage at 4-6 weeks post-op - Can consider silicone based products for scar healing starting at 6 weeks post-op - Ok to continue ointment daily to wound under a bandage for another week  HISTORY OF SQUAMOUS CELL CARCINOMA OF THE SKIN - No evidence of recurrence today - Recommend regular full  body skin exams - Recommend daily broad spectrum sunscreen SPF 30+ to sun-exposed areas, reapply every 2 hours as needed.  - Call if any new or changing lesions are noted between office visits AK (ACTINIC KERATOSIS) Mid Forehead - Destruction of lesion - Mid Forehead Complexity: simple   Destruction method: cryotherapy   Informed consent: discussed and consent obtained   Timeout:  patient name, date of birth, surgical site, and procedure verified Outcome: patient tolerated procedure well with no complications   Post-procedure details: wound care instructions given    SCAR   SQUAMOUS CELL CARCINOMA IN SITU    Return for TBSE with brenda is fine.  I, Darice Smock, CMA, am acting as scribe for RUFUS CHRISTELLA HOLY, MD.   Documentation: I have reviewed the above documentation for accuracy and completeness, and I agree with the above.  RUFUS CHRISTELLA HOLY, MD  "

## 2025-03-02 ENCOUNTER — Encounter: Admitting: Family Medicine

## 2025-03-07 ENCOUNTER — Ambulatory Visit: Admitting: Physician Assistant

## 2025-03-27 ENCOUNTER — Ambulatory Visit
